# Patient Record
Sex: Female | Born: 1990 | Hispanic: Yes | Smoking: Never smoker
Health system: Southern US, Community
[De-identification: ages and names within clinical notes are randomized; demographics above are authoritative.]

## PROBLEM LIST (undated history)

## (undated) DIAGNOSIS — Z789 Other specified health status: Secondary | ICD-10-CM

## (undated) DIAGNOSIS — D573 Sickle-cell trait: Secondary | ICD-10-CM

## (undated) DIAGNOSIS — O0930 Supervision of pregnancy with insufficient antenatal care, unspecified trimester: Secondary | ICD-10-CM

## (undated) DIAGNOSIS — O24419 Gestational diabetes mellitus in pregnancy, unspecified control: Secondary | ICD-10-CM

## (undated) DIAGNOSIS — O1490 Unspecified pre-eclampsia, unspecified trimester: Secondary | ICD-10-CM

## (undated) DIAGNOSIS — A5901 Trichomonal vulvovaginitis: Secondary | ICD-10-CM

## (undated) HISTORY — DX: Gestational diabetes mellitus in pregnancy, unspecified control: O24.419

## (undated) HISTORY — PX: NO PAST SURGERIES: SHX2092

## (undated) HISTORY — DX: Unspecified pre-eclampsia, unspecified trimester: O14.90

---

## 2010-09-02 NOTE — L&D Delivery Note (Signed)
Delivery Note At 2100 a viable female was delivered via VAVD (Presentation: ROA ;  ).  Thick meconium present at delivery. APGAR: 8, 9; weight 7lb 11oz.   Placenta status: spontaneous, intact.  Cord: 3-vessel with the following complications: none.    Anesthesia:  epidural Episiotomy: n/a Lacerations: hemastatic 1st degree perineal, right labial repair Suture Repair: 3.0 vicryl Est. Blood Loss (mL): 300  Mom to postpartum.  Baby to nursery-stable.  BOOTH, ERIN 06/01/2011, 9:22 PM

## 2011-01-29 ENCOUNTER — Other Ambulatory Visit: Payer: Self-pay | Admitting: Family Medicine

## 2011-01-29 DIAGNOSIS — Z3689 Encounter for other specified antenatal screening: Secondary | ICD-10-CM

## 2011-01-29 LAB — HIV ANTIBODY (ROUTINE TESTING W REFLEX): HIV: NONREACTIVE

## 2011-01-29 LAB — GC/CHLAMYDIA PROBE AMP, GENITAL: Gonorrhea: NEGATIVE

## 2011-01-29 LAB — ABO/RH: RH Type: POSITIVE

## 2011-01-31 ENCOUNTER — Ambulatory Visit (HOSPITAL_COMMUNITY)
Admission: RE | Admit: 2011-01-31 | Discharge: 2011-01-31 | Disposition: A | Discharge status: Home / Self Care | Payer: Medicaid Other | Source: Ambulatory Visit | Attending: Family Medicine | Admitting: Family Medicine

## 2011-01-31 DIAGNOSIS — O358XX0 Maternal care for other (suspected) fetal abnormality and damage, not applicable or unspecified: Secondary | ICD-10-CM | POA: Insufficient documentation

## 2011-01-31 DIAGNOSIS — Z1389 Encounter for screening for other disorder: Secondary | ICD-10-CM | POA: Insufficient documentation

## 2011-01-31 DIAGNOSIS — Z363 Encounter for antenatal screening for malformations: Secondary | ICD-10-CM | POA: Insufficient documentation

## 2011-01-31 DIAGNOSIS — Z3689 Encounter for other specified antenatal screening: Secondary | ICD-10-CM

## 2011-03-18 LAB — HIV ANTIBODY (ROUTINE TESTING W REFLEX): HIV: NONREACTIVE

## 2011-03-18 LAB — RPR: RPR: NONREACTIVE

## 2011-05-17 ENCOUNTER — Encounter (HOSPITAL_COMMUNITY): Payer: Self-pay | Admitting: *Deleted

## 2011-05-17 ENCOUNTER — Inpatient Hospital Stay (HOSPITAL_COMMUNITY)
Admission: AD | Admit: 2011-05-17 | Discharge: 2011-05-18 | Disposition: A | Discharge status: Home / Self Care | Payer: Medicaid Other | Source: Ambulatory Visit | Attending: Obstetrics and Gynecology | Admitting: Obstetrics and Gynecology

## 2011-05-17 DIAGNOSIS — A5901 Trichomonal vulvovaginitis: Secondary | ICD-10-CM | POA: Insufficient documentation

## 2011-05-17 DIAGNOSIS — O479 False labor, unspecified: Secondary | ICD-10-CM | POA: Insufficient documentation

## 2011-05-17 DIAGNOSIS — O98819 Other maternal infectious and parasitic diseases complicating pregnancy, unspecified trimester: Secondary | ICD-10-CM | POA: Insufficient documentation

## 2011-05-17 NOTE — Progress Notes (Signed)
Pt states, " I stated having contractions in my low abd that goes around to my back at 8:00 pm tonight. I have one every 30 min."

## 2011-05-18 ENCOUNTER — Encounter (HOSPITAL_COMMUNITY): Payer: Self-pay | Admitting: *Deleted

## 2011-05-18 NOTE — ED Provider Notes (Signed)
History   20yo G1/0 @37 .2 who presents with contractions since 2030. Denies VB or LOF. Good FM. No other complaints. Seen in clinic yesterday, cervical exam was "normal." Also reports that she was dx with trichomonas yesterday and just began antibiotics. Denies discharge or discomfort.   Pregnancy complicated by sickle cell trait.   Chief Complaint  Patient presents with  . Contractions   HPI  OB History    Grav Para Term Preterm Abortions TAB SAB Ect Mult Living   1               No past medical history on file.  No past surgical history on file.  No family history on file.  History  Substance Use Topics  . Smoking status: Not on file  . Smokeless tobacco: Not on file  . Alcohol Use: Not on file    Allergies:  Allergies  Allergen Reactions  . Penicillins Rash    Skin gets red    Prescriptions prior to admission  Medication Sig Dispense Refill  . prenatal vitamin w/FE, FA (PRENATAL 1 + 1) 27-1 MG TABS Take 1 tablet by mouth daily.        Marland Kitchen UNKNOWN TO PATIENT Take 1 tablet by mouth daily.          Review of Systems  Constitutional: Negative for fever and chills.  Eyes: Negative for blurred vision and double vision.  Respiratory: Negative for cough, shortness of breath and wheezing.   Cardiovascular: Negative for chest pain and leg swelling.  Gastrointestinal: Negative for heartburn, nausea and diarrhea.  Genitourinary: Negative for dysuria and urgency.  Skin: Negative for itching and rash.  Neurological: Negative for dizziness, seizures, loss of consciousness and headaches.  Endo/Heme/Allergies: Negative for polydipsia.  Psychiatric/Behavioral: Negative for depression.   Physical Exam   Blood pressure 132/81, pulse 79, temperature 97.3 F (36.3 C), temperature source Oral, resp. rate 20, height 5' (1.524 m), weight 180 lb 6 oz (81.818 kg).  Physical Exam  Constitutional: She is oriented to person, place, and time. She appears well-developed and  well-nourished.  HENT:  Head: Normocephalic and atraumatic.  Eyes: Conjunctivae are normal. Pupils are equal, round, and reactive to light.  Neck: Normal range of motion. Neck supple.  Cardiovascular: Normal rate, regular rhythm and normal heart sounds.  Exam reveals no gallop and no friction rub.   No murmur heard. Respiratory: Breath sounds normal. No respiratory distress. She has no wheezes. She has no rales. She exhibits no tenderness.  GI: Soft. Bowel sounds are normal. She exhibits no distension.  Genitourinary: Vagina normal. No vaginal discharge found.  Musculoskeletal: Normal range of motion. She exhibits no edema and no tenderness.  Neurological: She is alert and oriented to person, place, and time. No cranial nerve deficit. Coordination normal.  Skin: Skin is warm and dry. No rash noted. No erythema.    MAU Course  Procedures  MDM NST  Assessment and Plan  20yo G1 @37 -2 here for r/o Labor 1)R/o Labor - Cervix unchanged. Labor precautions discussed. D/c home. 2)FWB - Reactive NST, reassuring 3)Trichomonas - Patient reports that she was put on an unknown medication QD x7 days. Has f/u appt. On Thursday. Instructed to continue medication, keep her next appointment. 4)Sickle cell trait 5)+/I/? 6)keep next scheduled appt.  Daylah Sayavong, Beverely Pace A 05/18/2011, 1:32 AM

## 2011-06-01 ENCOUNTER — Encounter (HOSPITAL_COMMUNITY): Payer: Self-pay | Admitting: Obstetrics and Gynecology

## 2011-06-01 ENCOUNTER — Inpatient Hospital Stay (HOSPITAL_COMMUNITY): Payer: Medicaid Other | Admitting: Anesthesiology

## 2011-06-01 ENCOUNTER — Inpatient Hospital Stay (HOSPITAL_COMMUNITY)
Admission: AD | Admit: 2011-06-01 | Discharge: 2011-06-03 | DRG: 775 | Disposition: A | Discharge status: Home / Self Care | Payer: Medicaid Other | Source: Ambulatory Visit | Attending: Obstetrics & Gynecology | Admitting: Obstetrics & Gynecology

## 2011-06-01 ENCOUNTER — Encounter (HOSPITAL_COMMUNITY): Payer: Self-pay | Admitting: Anesthesiology

## 2011-06-01 DIAGNOSIS — Z22330 Carrier of Group B streptococcus: Secondary | ICD-10-CM

## 2011-06-01 DIAGNOSIS — O99892 Other specified diseases and conditions complicating childbirth: Secondary | ICD-10-CM | POA: Diagnosis present

## 2011-06-01 HISTORY — DX: Other specified health status: Z78.9

## 2011-06-01 LAB — CBC
Platelets: 322 10*3/uL (ref 150–400)
RBC: 5.12 MIL/uL — ABNORMAL HIGH (ref 3.87–5.11)
RDW: 16.6 % — ABNORMAL HIGH (ref 11.5–15.5)
WBC: 10.1 10*3/uL (ref 4.0–10.5)

## 2011-06-01 LAB — RPR: RPR Ser Ql: NONREACTIVE

## 2011-06-01 MED ORDER — OXYTOCIN BOLUS FROM INFUSION
500.0000 mL | Freq: Once | INTRAVENOUS | Status: DC
  Filled 2011-06-01: qty 1000
  Filled 2011-06-01: qty 500

## 2011-06-01 MED ORDER — PHENYLEPHRINE 40 MCG/ML (10ML) SYRINGE FOR IV PUSH (FOR BLOOD PRESSURE SUPPORT)
80.0000 ug | PREFILLED_SYRINGE | INTRAVENOUS | Status: DC | PRN
  Filled 2011-06-01: qty 5

## 2011-06-01 MED ORDER — LIDOCAINE HCL (PF) 1 % IJ SOLN
30.0000 mL | INTRAMUSCULAR | Status: DC | PRN
  Filled 2011-06-01 (×2): qty 30

## 2011-06-01 MED ORDER — LACTATED RINGERS IV SOLN
500.0000 mL | INTRAVENOUS | Status: DC | PRN
  Administered 2011-06-01: 1000 mL via INTRAVENOUS

## 2011-06-01 MED ORDER — CEFAZOLIN SODIUM 1-5 GM-% IV SOLN
1.0000 g | Freq: Three times a day (TID) | INTRAVENOUS | Status: DC
  Administered 2011-06-01: 1 g via INTRAVENOUS
  Filled 2011-06-01 (×3): qty 50

## 2011-06-01 MED ORDER — PHENYLEPHRINE 40 MCG/ML (10ML) SYRINGE FOR IV PUSH (FOR BLOOD PRESSURE SUPPORT)
80.0000 ug | PREFILLED_SYRINGE | INTRAVENOUS | Status: DC | PRN
  Filled 2011-06-01 (×3): qty 5

## 2011-06-01 MED ORDER — DIPHENHYDRAMINE HCL 50 MG/ML IJ SOLN: 12.5000 mg | INTRAMUSCULAR | Status: DC | PRN

## 2011-06-01 MED ORDER — PNEUMOCOCCAL VAC POLYVALENT 25 MCG/0.5ML IJ INJ
0.5000 mL | INJECTION | INTRAMUSCULAR | Status: DC
  Filled 2011-06-01: qty 0.5

## 2011-06-01 MED ORDER — FENTANYL 2.5 MCG/ML BUPIVACAINE 1/10 % EPIDURAL INFUSION (WH - ANES)
14.0000 mL/h | INTRAMUSCULAR | Status: DC
  Administered 2011-06-01 (×2): 14 mL/h via EPIDURAL
  Administered 2011-06-01: 12 mL/h via EPIDURAL
  Filled 2011-06-01 (×2): qty 60

## 2011-06-01 MED ORDER — FENTANYL 2.5 MCG/ML BUPIVACAINE 1/10 % EPIDURAL INFUSION (WH - ANES)
14.0000 mL/h | INTRAMUSCULAR | Status: DC
  Filled 2011-06-01: qty 60

## 2011-06-01 MED ORDER — OXYTOCIN 20 UNITS IN LACTATED RINGERS INFUSION - SIMPLE: 125.0000 mL/h | Freq: Once | INTRAVENOUS | Status: DC

## 2011-06-01 MED ORDER — SODIUM BICARBONATE 8.4 % IV SOLN
INTRAVENOUS | Status: DC | PRN
  Administered 2011-06-01: 4 mL via EPIDURAL

## 2011-06-01 MED ORDER — ACETAMINOPHEN 325 MG PO TABS: 650.0000 mg | ORAL_TABLET | ORAL | Status: DC | PRN

## 2011-06-01 MED ORDER — CITRIC ACID-SODIUM CITRATE 334-500 MG/5ML PO SOLN: 30.0000 mL | ORAL | Status: DC | PRN

## 2011-06-01 MED ORDER — NALBUPHINE SYRINGE 5 MG/0.5 ML
5.0000 mg | INJECTION | INTRAMUSCULAR | Status: DC | PRN
  Filled 2011-06-01: qty 0.5

## 2011-06-01 MED ORDER — CEFAZOLIN SODIUM-DEXTROSE 2-3 GM-% IV SOLR
2.0000 g | Freq: Once | INTRAVENOUS | Status: AC
  Administered 2011-06-01: 2 g via INTRAVENOUS
  Filled 2011-06-01: qty 50

## 2011-06-01 MED ORDER — LACTATED RINGERS IV SOLN: 500.0000 mL | Freq: Once | INTRAVENOUS | Status: DC

## 2011-06-01 MED ORDER — SODIUM BICARBONATE 8.4 % IV SOLN
INTRAVENOUS | Status: DC | PRN
  Administered 2011-06-01: 3 mL via EPIDURAL

## 2011-06-01 MED ORDER — EPHEDRINE 5 MG/ML INJ
10.0000 mg | INTRAVENOUS | Status: DC | PRN
  Filled 2011-06-01 (×3): qty 4

## 2011-06-01 MED ORDER — FLEET ENEMA 7-19 GM/118ML RE ENEM: 1.0000 | ENEMA | RECTAL | Status: DC | PRN

## 2011-06-01 MED ORDER — ONDANSETRON HCL 4 MG/2ML IJ SOLN: 4.0000 mg | Freq: Four times a day (QID) | INTRAMUSCULAR | Status: DC | PRN

## 2011-06-01 MED ORDER — OXYCODONE-ACETAMINOPHEN 5-325 MG PO TABS: 2.0000 | ORAL_TABLET | ORAL | Status: DC | PRN

## 2011-06-01 MED ORDER — IBUPROFEN 600 MG PO TABS: 600.0000 mg | ORAL_TABLET | Freq: Four times a day (QID) | ORAL | Status: DC | PRN

## 2011-06-01 MED ORDER — LACTATED RINGERS IV SOLN
INTRAVENOUS | Status: DC
  Administered 2011-06-01: 13:00:00 via INTRAVENOUS

## 2011-06-01 MED ORDER — EPHEDRINE 5 MG/ML INJ
10.0000 mg | INTRAVENOUS | Status: DC | PRN
  Filled 2011-06-01: qty 4

## 2011-06-01 NOTE — Progress Notes (Signed)
Pt vomiting.

## 2011-06-01 NOTE — Progress Notes (Signed)
Dr Adrian Blackwater and Dr Jean Rosenthal notified pt not feeling pressure or urge to push.  See orders

## 2011-06-01 NOTE — Anesthesia Procedure Notes (Addendum)
Epidural Patient location during procedure: OB  Staffing Anesthesiologist: Cason Dabney EDWARD  Preanesthetic Checklist Completed: patient identified, site marked, surgical consent, pre-op evaluation, timeout performed, IV checked, risks and benefits discussed and monitors and equipment checked  Epidural Patient position: sitting Prep: site prepped and draped and DuraPrep Patient monitoring: continuous pulse ox and blood pressure Approach: midline Injection technique: LOR air  Needle:  Needle type: Tuohy  Needle gauge: 17 G Needle length: 9 cm Needle insertion depth: 5 cm cm Catheter type: closed end flexible Catheter size: 19 Gauge Catheter at skin depth: 10 cm Test dose: negative  Assessment Events: blood not aspirated, injection not painful, no injection resistance, negative IV test and no paresthesia  Additional Notes Dosing of Epidural: 1st dose, through needle...... ( mg expressed as equavilent  cc's medication  from .1%Bupiv / fentanyl syringe from L&D pump)...............  4mg Marcaine  2nd dose, through catheter after waiting 3 minutes.... epi 1:200K + Xylocaine 40 mg 3rd dose, through catheter, after waiting 3 minutes.....epi 1:200K + Xylocaine 40 mg ( 2% Xylo charted as a single dose in Epic Meds for ease of charting; actual dosing was fractionated as above, for saftey's sake)  As each dose occurred, patient was free of IV sx; and patient exhibited no evidence of SA injection.  Patient is more comfortable after epidural dosed. Please see RN's note for documentation of vital signs,and FHR which are stable.    

## 2011-06-01 NOTE — H&P (Signed)
Shannon Diaz is a 20 y.o. female G1 P0 at 39 weeks and 2 days by LMP presenting with contractions. Maternal Medical History:  Reason for admission: Reason for admission: contractions.  Contractions: Onset was 3-5 hours ago.   Frequency: regular.   Perceived severity is moderate.    Fetal activity: Perceived fetal activity is decreased.   Last perceived fetal movement was within the past hour.    Prenatal complications: No bleeding, cholelithiasis, HIV, hypertension, infection, IUGR, nephrolithiasis, oligohydramnios, placental abnormality, polyhydramnios, pre-eclampsia, preterm labor, substance abuse, thrombocytopenia or thrombophilia.   Prenatal Complications - Diabetes: none.    OB History   Grav Para Term Preterm Abortions TAB SAB Ect Mult Living  1 0 0 0 0 0 0 0 0 0   Past Medical History Diagnosis Date . No pertinent past medical history   Past Surgical History Procedure Date . No past surgeries   Family History: family history is not on file. Social History:  reports that she has never smoked. She does not have any smokeless tobacco history on file. She reports that she does not drink alcohol or use illicit drugs.  Review of Systems  All other systems reviewed and are negative.    Dilation: 6 Effacement (%): 80 Station: -2 Exam by:: Dr.Inocencia Murtaugh  Blood pressure 129/79, pulse 91, temperature 98.6 F (37 C), temperature source Oral, resp. rate 20. Maternal Exam:  Uterine Assessment: Contraction strength is moderate.  Contraction frequency is regular.   Abdomen: Patient reports no abdominal tenderness. Estimated fetal weight is Approximately 7/2 pounds.   Fetal presentation: vertex  Introitus: Normal vulva. Normal vagina.  Ferning test: not done.  Nitrazine test: not done. Amniotic fluid character: not assessed.  Pelvis: adequate for delivery.   Cervix: Cervix evaluated by digital exam.     Fetal Exam Fetal Monitor Review: Mode: hand-held doppler  probe.   Baseline rate: 140.  Variability: moderate (6-25 bpm).   Pattern: accelerations present and no decelerations.    Fetal State Assessment: Category I - tracings are normal.     Physical Exam  Constitutional: She appears well-developed and well-nourished.  Cardiovascular: Normal rate and regular rhythm.   Respiratory: Effort normal and breath sounds normal.  GI: Soft. Bowel sounds are normal.  Skin: Skin is warm and dry.    Dilation: 6 Effacement (%): 80 Cervical Position: Posterior Station: -2 Presentation: Vertex Exam by:: Dr.Latoria Dry   Prenatal labs: ABO, Rh: A/Positive/-- (05/29 0000) Antibody:   Rubella: Immune (05/29 0000) RPR: Nonreactive (07/16 0000)  HBsAg: Negative (05/29 0000)  HIV: Non-reactive (07/16 0000)  GBS:     Assessment/Plan: #1  20-year-old G1 P0 with intrauterine pregnancy at 39 weeks and 2 days in spontaneous labor #2 GBS positive #3 language barrier.  Limits patient to labor and delivery and continue to manage the patient's labor. As the patient is group B strep positive and penicillin allergic we will start the patient in the per prophylaxis. The patient's allergy to penicillin is a rash therefore we will start cefazolin. The parents did not have any doctor for the baby yet therefore the baby will be seen by the pediatric teaching service. The patient will breast and bottlefeed. The patient will have Depo-Provera for contraception following the birth.  Bernadette Gores JEHIEL 06/01/2011, 10:27 AM    

## 2011-06-01 NOTE — Plan of Care (Signed)
Problem: Consults Goal: Birthing Suites Patient Information Press F2 to bring up selections list   Pt 37-[redacted] weeks EGA     

## 2011-06-01 NOTE — Progress Notes (Signed)
Subjective: Comfortable with epidural.  No questions or concerns.  Objective: BP 99/61  Pulse 82  Temp(Src) 98.5 F (36.9 C) (Oral)  Resp 20  Ht 5' (1.524 m)  Wt 81.647 kg (180 lb)  BMI 35.15 kg/m2  SpO2 99%      FHT:  FHR: 120 bpm, variability: moderate,  accelerations:  Present,  decelerations:  Absent UC:   regular, every 3 minutes SVE:   Dilation: 7 Effacement (%): 80 Station: -1 Exam by:: Dr Adrian Blackwater  Labs: Lab Results  Component Value Date   WBC 10.1 06/01/2011   HGB 12.5 06/01/2011   HCT 37.3 06/01/2011   MCV 72.9* 06/01/2011   PLT 322 06/01/2011    Assessment / Plan: Spontaneous labor, progressing normally  Labor: AROM with clear fluid Preeclampsia:  NA Fetal Wellbeing:  Category I Pain Control:  Epidural I/D:  cefazolin for GBS Anticipated MOD:  NSVD  Tyler Cubit JEHIEL 06/01/2011, 2:47 PM

## 2011-06-01 NOTE — Progress Notes (Signed)
Pt not pushing effectively, pt not feeling pressure or uc's at this time

## 2011-06-01 NOTE — Progress Notes (Signed)
Pt presents to MAU with complaints of contractions that started at 0700 today. Pt is seen at [facility]. At was seen this week where they told her she was 4-5 cm. Pt states the contractions are Q10 mins.

## 2011-06-01 NOTE — ED Provider Notes (Signed)
Shannon Diaz is a 20 y.o. female G1 P0 at 39 weeks and 2 days by LMP presenting with contractions. Maternal Medical History:  Reason for admission: Reason for admission: contractions.  Contractions: Onset was 3-5 hours ago.   Frequency: regular.   Perceived severity is moderate.    Fetal activity: Perceived fetal activity is decreased.   Last perceived fetal movement was within the past hour.    Prenatal complications: No bleeding, cholelithiasis, HIV, hypertension, infection, IUGR, nephrolithiasis, oligohydramnios, placental abnormality, polyhydramnios, pre-eclampsia, preterm labor, substance abuse, thrombocytopenia or thrombophilia.   Prenatal Complications - Diabetes: none.    OB History   Grav Para Term Preterm Abortions TAB SAB Ect Mult Living  1 0 0 0 0 0 0 0 0 0    Past Medical History Diagnosis Date . No pertinent past medical history   Past Surgical History Procedure Date . No past surgeries   Family History: family history is not on file. Social History:  reports that she has never smoked. She does not have any smokeless tobacco history on file. She reports that she does not drink alcohol or use illicit drugs.  Review of Systems  All other systems reviewed and are negative.    Dilation: 6 Effacement (%): 80 Station: -2 Exam by:: Dr.Verdon Ferrante  Blood pressure 129/79, pulse 91, temperature 98.6 F (37 C), temperature source Oral, resp. rate 20. Maternal Exam:  Uterine Assessment: Contraction strength is moderate.  Contraction frequency is regular.   Abdomen: Patient reports no abdominal tenderness. Estimated fetal weight is Approximately 7/2 pounds.   Fetal presentation: vertex  Introitus: Normal vulva. Normal vagina.  Ferning test: not done.  Nitrazine test: not done. Amniotic fluid character: not assessed.  Pelvis: adequate for delivery.   Cervix: Cervix evaluated by digital exam.     Fetal Exam Fetal Monitor Review: Mode: hand-held doppler  probe.   Baseline rate: 140.  Variability: moderate (6-25 bpm).   Pattern: accelerations present and no decelerations.    Fetal State Assessment: Category I - tracings are normal.     Physical Exam  Constitutional: She appears well-developed and well-nourished.  Cardiovascular: Normal rate and regular rhythm.   Respiratory: Effort normal and breath sounds normal.  GI: Soft. Bowel sounds are normal.  Skin: Skin is warm and dry.    Dilation: 6 Effacement (%): 80 Cervical Position: Posterior Station: -2 Presentation: Vertex Exam by:: Dr.Dontrelle Mazon   Prenatal labs: ABO, Rh: A/Positive/-- (05/29 0000) Antibody:   Rubella: Immune (05/29 0000) RPR: Nonreactive (07/16 0000)  HBsAg: Negative (05/29 0000)  HIV: Non-reactive (07/16 0000)  GBS:     Assessment/Plan: #32  20 year old G1 P0 with intrauterine pregnancy at 39 weeks and 2 days in spontaneous labor #2 GBS positive #3 language barrier.  Limits patient to labor and delivery and continue to manage the patient's labor. As the patient is group B strep positive and penicillin allergic we will start the patient in the per prophylaxis. The patient's allergy to penicillin is a rash therefore we will start cefazolin. The parents did not have any doctor for the baby yet therefore the baby will be seen by the pediatric teaching service. The patient will breast and bottlefeed. The patient will have Depo-Provera for contraception following the birth.  Hasani Diemer JEHIEL 06/01/2011, 10:27 AM

## 2011-06-01 NOTE — Anesthesia Preprocedure Evaluation (Signed)
Anesthesia Evaluation  Name, MR# and DOB Patient awake  General Assessment Comment  Reviewed: Allergy & Precautions, H&P , Patient's Chart, lab work & pertinent test results  Airway Mallampati: II TM Distance: >3 FB Neck ROM: full    Dental  (+) Teeth Intact   Pulmonary  clear to auscultation  breath sounds clear to auscultation none    Cardiovascular regular Normal    Neuro/Psych   GI/Hepatic/Renal   Endo/Other  (+)   Morbid obesity  Abdominal   Musculoskeletal   Hematology   Peds  Reproductive/Obstetrics (+) Pregnancy    Anesthesia Other Findings                 Anesthesia Physical Anesthesia Plan  ASA: II  Anesthesia Plan: Epidural   Post-op Pain Management:    Induction:   Airway Management Planned:   Additional Equipment:   Intra-op Plan:   Post-operative Plan:   Informed Consent: I have reviewed the patients History and Physical, chart, labs and discussed the procedure including the risks, benefits and alternatives for the proposed anesthesia with the patient or authorized representative who has indicated his/her understanding and acceptance.   Dental Advisory Given  Plan Discussed with: CRNA and Surgeon  Anesthesia Plan Comments: (Labs checked- platelets confirmed with RN in room. Fetal heart tracing, per RN, reportedly stable enough for sitting procedure. Discussed epidural, and patient consents to the procedure:  included risk of possible headache,backache, failed block, allergic reaction, and nerve injury. This patient was asked if she had any questions or concerns before the procedure started. )        Anesthesia Quick Evaluation

## 2011-06-02 MED ORDER — ZOLPIDEM TARTRATE 5 MG PO TABS: 5.0000 mg | ORAL_TABLET | Freq: Every evening | ORAL | Status: DC | PRN

## 2011-06-02 MED ORDER — DIBUCAINE 1 % RE OINT: 1.0000 "application " | TOPICAL_OINTMENT | RECTAL | Status: DC | PRN

## 2011-06-02 MED ORDER — OXYCODONE-ACETAMINOPHEN 5-325 MG PO TABS
1.0000 | ORAL_TABLET | ORAL | Status: DC | PRN
  Administered 2011-06-03: 1 via ORAL
  Filled 2011-06-02: qty 1

## 2011-06-02 MED ORDER — SIMETHICONE 80 MG PO CHEW: 80.0000 mg | CHEWABLE_TABLET | ORAL | Status: DC | PRN

## 2011-06-02 MED ORDER — TETANUS-DIPHTH-ACELL PERTUSSIS 5-2.5-18.5 LF-MCG/0.5 IM SUSP
0.5000 mL | Freq: Once | INTRAMUSCULAR | Status: AC
  Administered 2011-06-02: 0.5 mL via INTRAMUSCULAR
  Filled 2011-06-02 (×2): qty 0.5

## 2011-06-02 MED ORDER — DIPHENHYDRAMINE HCL 25 MG PO CAPS: 25.0000 mg | ORAL_CAPSULE | Freq: Four times a day (QID) | ORAL | Status: DC | PRN

## 2011-06-02 MED ORDER — INFLUENZA VIRUS VACC SPLIT PF IM SUSP
0.5000 mL | Freq: Once | INTRAMUSCULAR | Status: AC
  Administered 2011-06-03: 0.5 mL via INTRAMUSCULAR
  Filled 2011-06-02: qty 0.5

## 2011-06-02 MED ORDER — WITCH HAZEL-GLYCERIN EX PADS: 1.0000 "application " | MEDICATED_PAD | CUTANEOUS | Status: DC | PRN

## 2011-06-02 MED ORDER — LANOLIN HYDROUS EX OINT: TOPICAL_OINTMENT | CUTANEOUS | Status: DC | PRN

## 2011-06-02 MED ORDER — ONDANSETRON HCL 4 MG PO TABS: 4.0000 mg | ORAL_TABLET | ORAL | Status: DC | PRN

## 2011-06-02 MED ORDER — PRENATAL PLUS 27-1 MG PO TABS
1.0000 | ORAL_TABLET | Freq: Every day | ORAL | Status: DC
  Administered 2011-06-02 – 2011-06-03 (×2): 1 via ORAL
  Filled 2011-06-02 (×2): qty 1

## 2011-06-02 MED ORDER — IBUPROFEN 600 MG PO TABS
600.0000 mg | ORAL_TABLET | Freq: Four times a day (QID) | ORAL | Status: DC
  Administered 2011-06-02 – 2011-06-03 (×7): 600 mg via ORAL
  Filled 2011-06-02 (×6): qty 1

## 2011-06-02 MED ORDER — ONDANSETRON HCL 4 MG/2ML IJ SOLN: 4.0000 mg | INTRAMUSCULAR | Status: DC | PRN

## 2011-06-02 MED ORDER — SENNOSIDES-DOCUSATE SODIUM 8.6-50 MG PO TABS
2.0000 | ORAL_TABLET | Freq: Every day | ORAL | Status: DC
  Administered 2011-06-02: 2 via ORAL

## 2011-06-02 MED ORDER — BENZOCAINE-MENTHOL 20-0.5 % EX AERO
INHALATION_SPRAY | CUTANEOUS | Status: AC
  Administered 2011-06-02: 06:00:00
  Filled 2011-06-02: qty 56

## 2011-06-02 MED ORDER — BENZOCAINE-MENTHOL 20-0.5 % EX AERO
1.0000 "application " | INHALATION_SPRAY | CUTANEOUS | Status: DC | PRN
  Administered 2011-06-02: 1 via TOPICAL

## 2011-06-02 NOTE — Progress Notes (Signed)
Post Partum Day 1 Subjective: no complaints, up ad lib, voiding, tolerating PO, + flatus and no BM yet.  Br/Bo feeding.  Planning on depo for birth control.  Objective: Blood pressure 104/69, pulse 78, temperature 97.4 F (36.3 C), temperature source Oral, resp. rate 18, height 5' (1.524 m), weight 180 lb (81.647 kg), SpO2 99.00%, unknown if currently breastfeeding.  Physical Exam:  General: alert, cooperative, appears stated age and no distress Lochia: appropriate Uterine Fundus: firm DVT Evaluation: No evidence of DVT seen on physical exam. Negative Homan's sign. No significant calf/ankle edema.   Basename 06/01/11 1100  HGB 12.5  HCT 37.3    Assessment/Plan: Plan for discharge tomorrow   LOS: 1 day   Diaz, Shannon Polyakov 06/02/2011, 7:24 AM

## 2011-06-02 NOTE — Anesthesia Postprocedure Evaluation (Signed)
  Anesthesia Post-op Note  Patient: Shannon Diaz  Procedure(s) Performed: * No procedures listed *  Patient Location: PACU and Mother/Baby  Anesthesia Type: Epidural  Level of Consciousness: awake, alert  and oriented  Airway and Oxygen Therapy: Patient Spontanous Breathing   Post-op Assessment: Patient's Cardiovascular Status Stable and Respiratory Function Stable  Post-op Vital Signs: stable  Complications: No apparent anesthesia complications

## 2011-06-03 MED ORDER — OXYCODONE-ACETAMINOPHEN 5-325 MG PO TABS: 1.0000 | ORAL_TABLET | ORAL | Status: AC | PRN

## 2011-06-03 MED ORDER — IBUPROFEN 600 MG PO TABS: 600.0000 mg | ORAL_TABLET | Freq: Four times a day (QID) | ORAL | Status: AC

## 2011-06-03 NOTE — Progress Notes (Signed)
Post Partum Day 2 Subjective: no complaints, up ad lib, voiding, tolerating PO and + flatus  Objective: Blood pressure 107/74, pulse 81, temperature 97.9 F (36.6 C), temperature source Oral, resp. rate 18, height 5' (1.524 m), weight 81.647 kg (180 lb), SpO2 97.00%, unknown if currently breastfeeding.  Physical Exam:  General: alert, cooperative, appears stated age and no distress Lochia: appropriate Uterine Fundus: firm Incision: n/a DVT Evaluation: No evidence of DVT seen on physical exam. Negative Homan's sign. No cords or calf tenderness. No significant calf/ankle edema.   Basename 06/01/11 1100  HGB 12.5  HCT 37.3    Assessment/Plan: Discharge home   LOS: 2 days   Shannon Diaz 06/03/2011, 7:33 AM

## 2011-06-03 NOTE — Progress Notes (Signed)
UR chart review completed.  

## 2011-06-03 NOTE — Discharge Summary (Signed)
Obstetric Discharge Summary Reason for Admission: onset of labor Prenatal Procedures: ultrasound Intrapartum Procedures: spontaneous vaginal delivery Postpartum Procedures: none Complications-Operative and Postpartum: none Hemoglobin  Date Value Range Status  06/01/2011 12.5  12.0-15.0 (g/dL) Final     HCT  Date Value Range Status  06/01/2011 37.3  36.0-46.0 (%) Final    Discharge Diagnoses: Term Pregnancy-delivered  Discharge Information: Date: 06/03/2011 Activity: pelvic rest Diet: routine Medications: PNV, Ibuprofen and Percocet Condition: stable and improved Instructions: refer to practice specific booklet Discharge to: home   Newborn Data: Live born female  Birth Weight: 7 lb 11.1 oz (3490 g) APGAR: 8, 9  Home with mother.  Shannon Diaz 06/03/2011, 7:34 AM

## 2011-06-04 NOTE — Discharge Summary (Signed)
Agree with above note.  Shannon Diaz H. 06/04/2011 5:01 PM

## 2012-03-02 LAB — OB RESULTS CONSOLE RPR: RPR: NONREACTIVE

## 2012-03-02 LAB — OB RESULTS CONSOLE GC/CHLAMYDIA
Chlamydia: NEGATIVE
Gonorrhea: NEGATIVE

## 2012-03-02 LAB — OB RESULTS CONSOLE HIV ANTIBODY (ROUTINE TESTING): HIV: NONREACTIVE

## 2012-06-01 ENCOUNTER — Other Ambulatory Visit (HOSPITAL_COMMUNITY)
Admission: RE | Admit: 2012-06-01 | Discharge: 2012-06-01 | Disposition: A | Discharge status: Home / Self Care | Payer: Medicaid Other | Source: Ambulatory Visit | Attending: Obstetrics and Gynecology | Admitting: Obstetrics and Gynecology

## 2012-06-01 DIAGNOSIS — Z113 Encounter for screening for infections with a predominantly sexual mode of transmission: Secondary | ICD-10-CM | POA: Insufficient documentation

## 2012-06-01 DIAGNOSIS — Z01419 Encounter for gynecological examination (general) (routine) without abnormal findings: Secondary | ICD-10-CM | POA: Insufficient documentation

## 2012-06-01 LAB — OB RESULTS CONSOLE GBS
GBS: NEGATIVE
GBS: POSITIVE

## 2012-09-05 ENCOUNTER — Encounter (HOSPITAL_COMMUNITY): Payer: Self-pay | Admitting: Anesthesiology

## 2012-09-05 ENCOUNTER — Encounter (HOSPITAL_COMMUNITY): Payer: Self-pay | Admitting: *Deleted

## 2012-09-05 ENCOUNTER — Inpatient Hospital Stay (HOSPITAL_COMMUNITY)
Admission: AD | Admit: 2012-09-05 | Discharge: 2012-09-08 | DRG: 765 | Disposition: A | Discharge status: Home / Self Care | Payer: Medicaid Other | Source: Ambulatory Visit | Attending: Obstetrics & Gynecology | Admitting: Obstetrics & Gynecology

## 2012-09-05 ENCOUNTER — Inpatient Hospital Stay (HOSPITAL_COMMUNITY): Payer: Medicaid Other | Admitting: Anesthesiology

## 2012-09-05 ENCOUNTER — Encounter (HOSPITAL_COMMUNITY)
Admission: AD | Disposition: A | Discharge status: Home / Self Care | Payer: Self-pay | Source: Ambulatory Visit | Attending: Obstetrics & Gynecology

## 2012-09-05 DIAGNOSIS — Z22330 Carrier of Group B streptococcus: Secondary | ICD-10-CM

## 2012-09-05 DIAGNOSIS — O4590 Premature separation of placenta, unspecified, unspecified trimester: Secondary | ICD-10-CM

## 2012-09-05 DIAGNOSIS — O9903 Anemia complicating the puerperium: Secondary | ICD-10-CM | POA: Diagnosis not present

## 2012-09-05 DIAGNOSIS — O4290 Premature rupture of membranes, unspecified as to length of time between rupture and onset of labor, unspecified weeks of gestation: Secondary | ICD-10-CM

## 2012-09-05 DIAGNOSIS — O99892 Other specified diseases and conditions complicating childbirth: Secondary | ICD-10-CM | POA: Diagnosis present

## 2012-09-05 DIAGNOSIS — D649 Anemia, unspecified: Secondary | ICD-10-CM | POA: Diagnosis not present

## 2012-09-05 DIAGNOSIS — Z98891 History of uterine scar from previous surgery: Secondary | ICD-10-CM

## 2012-09-05 HISTORY — DX: Sickle-cell trait: D57.3

## 2012-09-05 HISTORY — DX: Supervision of pregnancy with insufficient antenatal care, unspecified trimester: O09.30

## 2012-09-05 HISTORY — DX: Trichomonal vulvovaginitis: A59.01

## 2012-09-05 LAB — CBC
HCT: 33.8 % — ABNORMAL LOW (ref 36.0–46.0)
MCHC: 33.7 g/dL (ref 30.0–36.0)
MCV: 72.4 fL — ABNORMAL LOW (ref 78.0–100.0)
Platelets: 366 10*3/uL (ref 150–400)
RDW: 16 % — ABNORMAL HIGH (ref 11.5–15.5)
WBC: 13.6 10*3/uL — ABNORMAL HIGH (ref 4.0–10.5)

## 2012-09-05 LAB — POCT FERN TEST: POCT Fern Test: POSITIVE

## 2012-09-05 LAB — PREPARE RBC (CROSSMATCH)

## 2012-09-05 LAB — ABO/RH: ABO/RH(D): A POS

## 2012-09-05 SURGERY — Surgical Case
Anesthesia: Epidural | Site: Abdomen | Wound class: Clean Contaminated

## 2012-09-05 MED ORDER — FLEET ENEMA 7-19 GM/118ML RE ENEM: 1.0000 | ENEMA | RECTAL | Status: DC | PRN

## 2012-09-05 MED ORDER — LACTATED RINGERS IV SOLN
INTRAVENOUS | Status: DC
  Administered 2012-09-05: 20:00:00 via INTRAVENOUS
  Administered 2012-09-05: 125 mL/h via INTRAVENOUS

## 2012-09-05 MED ORDER — ONDANSETRON HCL 4 MG/2ML IJ SOLN: 4.0000 mg | Freq: Four times a day (QID) | INTRAMUSCULAR | Status: DC | PRN

## 2012-09-05 MED ORDER — LIDOCAINE HCL (PF) 1 % IJ SOLN
30.0000 mL | INTRAMUSCULAR | Status: DC | PRN
  Filled 2012-09-05: qty 30

## 2012-09-05 MED ORDER — DIPHENHYDRAMINE HCL 50 MG/ML IJ SOLN: 12.5000 mg | INTRAMUSCULAR | Status: DC | PRN

## 2012-09-05 MED ORDER — PHENYLEPHRINE 40 MCG/ML (10ML) SYRINGE FOR IV PUSH (FOR BLOOD PRESSURE SUPPORT): 80.0000 ug | PREFILLED_SYRINGE | INTRAVENOUS | Status: DC | PRN

## 2012-09-05 MED ORDER — FENTANYL CITRATE 0.05 MG/ML IJ SOLN
INTRAMUSCULAR | Status: DC | PRN
  Administered 2012-09-05: 100 ug via INTRAVENOUS

## 2012-09-05 MED ORDER — TERBUTALINE SULFATE 1 MG/ML IJ SOLN
INTRAMUSCULAR | Status: AC
  Administered 2012-09-05: 23:00:00
  Filled 2012-09-05: qty 1

## 2012-09-05 MED ORDER — CEFAZOLIN SODIUM 1-5 GM-% IV SOLN
INTRAVENOUS | Status: DC | PRN
  Administered 2012-09-05: 1 g via INTRAVENOUS

## 2012-09-05 MED ORDER — LACTATED RINGERS IV SOLN: 500.0000 mL | INTRAVENOUS | Status: DC | PRN

## 2012-09-05 MED ORDER — LACTATED RINGERS IV SOLN
INTRAVENOUS | Status: DC | PRN
  Administered 2012-09-05 (×3): via INTRAVENOUS

## 2012-09-05 MED ORDER — MORPHINE SULFATE (PF) 0.5 MG/ML IJ SOLN
INTRAMUSCULAR | Status: DC | PRN
  Administered 2012-09-05: 3.5 mg via EPIDURAL

## 2012-09-05 MED ORDER — FENTANYL 2.5 MCG/ML BUPIVACAINE 1/10 % EPIDURAL INFUSION (WH - ANES)
14.0000 mL/h | INTRAMUSCULAR | Status: DC
  Filled 2012-09-05: qty 125

## 2012-09-05 MED ORDER — FENTANYL 2.5 MCG/ML BUPIVACAINE 1/10 % EPIDURAL INFUSION (WH - ANES)
INTRAMUSCULAR | Status: DC | PRN
  Administered 2012-09-05: 14 mL/h via EPIDURAL

## 2012-09-05 MED ORDER — ONDANSETRON HCL 4 MG/2ML IJ SOLN
INTRAMUSCULAR | Status: AC
  Filled 2012-09-05: qty 2

## 2012-09-05 MED ORDER — CEFAZOLIN SODIUM-DEXTROSE 2-3 GM-% IV SOLR
INTRAVENOUS | Status: AC
  Filled 2012-09-05: qty 50

## 2012-09-05 MED ORDER — HYDROXYZINE HCL 50 MG/ML IM SOLN
50.0000 mg | Freq: Four times a day (QID) | INTRAMUSCULAR | Status: DC | PRN
  Filled 2012-09-05: qty 1

## 2012-09-05 MED ORDER — EPHEDRINE 5 MG/ML INJ: 10.0000 mg | INTRAVENOUS | Status: DC | PRN

## 2012-09-05 MED ORDER — CITRIC ACID-SODIUM CITRATE 334-500 MG/5ML PO SOLN
30.0000 mL | ORAL | Status: DC | PRN
  Administered 2012-09-05: 30 mL via ORAL
  Filled 2012-09-05: qty 15

## 2012-09-05 MED ORDER — OXYTOCIN 10 UNIT/ML IJ SOLN
INTRAMUSCULAR | Status: AC
  Filled 2012-09-05: qty 4

## 2012-09-05 MED ORDER — NALBUPHINE SYRINGE 5 MG/0.5 ML
5.0000 mg | INJECTION | INTRAMUSCULAR | Status: DC | PRN
  Administered 2012-09-05 (×2): 5 mg via INTRAVENOUS
  Filled 2012-09-05 (×2): qty 0.5

## 2012-09-05 MED ORDER — BUPIVACAINE HCL (PF) 0.5 % IJ SOLN
INTRAMUSCULAR | Status: AC
  Filled 2012-09-05: qty 30

## 2012-09-05 MED ORDER — LIDOCAINE HCL (PF) 1 % IJ SOLN
INTRAMUSCULAR | Status: DC | PRN
  Administered 2012-09-05 (×2): 9 mL

## 2012-09-05 MED ORDER — CEFAZOLIN SODIUM 1-5 GM-% IV SOLN
1.0000 g | Freq: Three times a day (TID) | INTRAVENOUS | Status: DC
  Administered 2012-09-05 – 2012-09-07 (×5): 1 g via INTRAVENOUS
  Filled 2012-09-05 (×7): qty 50

## 2012-09-05 MED ORDER — IBUPROFEN 600 MG PO TABS: 600.0000 mg | ORAL_TABLET | Freq: Four times a day (QID) | ORAL | Status: DC | PRN

## 2012-09-05 MED ORDER — SODIUM BICARBONATE 8.4 % IV SOLN
INTRAVENOUS | Status: DC | PRN
  Administered 2012-09-05: 10 mL via EPIDURAL

## 2012-09-05 MED ORDER — LACTATED RINGERS IV SOLN
500.0000 mL | Freq: Once | INTRAVENOUS | Status: AC
  Administered 2012-09-05: 500 mL via INTRAVENOUS

## 2012-09-05 MED ORDER — MORPHINE SULFATE 0.5 MG/ML IJ SOLN
INTRAMUSCULAR | Status: AC
  Filled 2012-09-05: qty 10

## 2012-09-05 MED ORDER — 0.9 % SODIUM CHLORIDE (POUR BTL) OPTIME
TOPICAL | Status: DC | PRN
  Administered 2012-09-05: 400 mL

## 2012-09-05 MED ORDER — ACETAMINOPHEN 325 MG PO TABS: 650.0000 mg | ORAL_TABLET | ORAL | Status: DC | PRN

## 2012-09-05 MED ORDER — OXYTOCIN 40 UNITS IN LACTATED RINGERS INFUSION - SIMPLE MED
62.5000 mL/h | INTRAVENOUS | Status: DC
  Filled 2012-09-05: qty 1000

## 2012-09-05 MED ORDER — SODIUM BICARBONATE 8.4 % IV SOLN
INTRAVENOUS | Status: AC
  Filled 2012-09-05: qty 50

## 2012-09-05 MED ORDER — FENTANYL CITRATE 0.05 MG/ML IJ SOLN
INTRAMUSCULAR | Status: AC
  Filled 2012-09-05: qty 2

## 2012-09-05 MED ORDER — CEFAZOLIN SODIUM-DEXTROSE 2-3 GM-% IV SOLR
2.0000 g | Freq: Once | INTRAVENOUS | Status: AC
  Administered 2012-09-05: 2 g via INTRAVENOUS
  Filled 2012-09-05: qty 50

## 2012-09-05 MED ORDER — MORPHINE SULFATE (PF) 0.5 MG/ML IJ SOLN
INTRAMUSCULAR | Status: DC | PRN
  Administered 2012-09-05: 1.5 mg via INTRAVENOUS

## 2012-09-05 MED ORDER — BUPIVACAINE HCL (PF) 0.5 % IJ SOLN
INTRAMUSCULAR | Status: DC | PRN
  Administered 2012-09-05: 30 mL

## 2012-09-05 MED ORDER — HYDROXYZINE HCL 50 MG PO TABS: 50.0000 mg | ORAL_TABLET | Freq: Four times a day (QID) | ORAL | Status: DC | PRN

## 2012-09-05 MED ORDER — OXYCODONE-ACETAMINOPHEN 5-325 MG PO TABS: 1.0000 | ORAL_TABLET | ORAL | Status: DC | PRN

## 2012-09-05 MED ORDER — PHENYLEPHRINE 40 MCG/ML (10ML) SYRINGE FOR IV PUSH (FOR BLOOD PRESSURE SUPPORT)
PREFILLED_SYRINGE | INTRAVENOUS | Status: AC
  Filled 2012-09-05: qty 10

## 2012-09-05 MED ORDER — EPHEDRINE 5 MG/ML INJ
10.0000 mg | INTRAVENOUS | Status: DC | PRN
  Filled 2012-09-05: qty 4

## 2012-09-05 MED ORDER — OXYTOCIN BOLUS FROM INFUSION: 500.0000 mL | INTRAVENOUS | Status: DC

## 2012-09-05 MED ORDER — PHENYLEPHRINE 40 MCG/ML (10ML) SYRINGE FOR IV PUSH (FOR BLOOD PRESSURE SUPPORT)
80.0000 ug | PREFILLED_SYRINGE | INTRAVENOUS | Status: DC | PRN
  Filled 2012-09-05: qty 5

## 2012-09-05 MED ORDER — ONDANSETRON HCL 4 MG/2ML IJ SOLN
INTRAMUSCULAR | Status: DC | PRN
  Administered 2012-09-05: 4 mg via INTRAVENOUS

## 2012-09-05 MED ORDER — OXYTOCIN 10 UNIT/ML IJ SOLN
40.0000 [IU] | INTRAVENOUS | Status: DC | PRN
  Administered 2012-09-05: 40 [IU] via INTRAVENOUS

## 2012-09-05 MED ORDER — LIDOCAINE-EPINEPHRINE (PF) 2 %-1:200000 IJ SOLN
INTRAMUSCULAR | Status: AC
  Filled 2012-09-05: qty 20

## 2012-09-05 SURGICAL SUPPLY — 36 items
BARRIER ADHS 3X4 INTERCEED (GAUZE/BANDAGES/DRESSINGS) IMPLANT
CLOTH BEACON ORANGE TIMEOUT ST (SAFETY) ×2 IMPLANT
CONTAINER PREFILL 10% NBF 15ML (MISCELLANEOUS) IMPLANT
DRAPE LG THREE QUARTER DISP (DRAPES) ×2 IMPLANT
DRESSING TELFA 8X3 (GAUZE/BANDAGES/DRESSINGS) IMPLANT
DRSG OPSITE POSTOP 4X10 (GAUZE/BANDAGES/DRESSINGS) ×2 IMPLANT
DURAPREP 26ML APPLICATOR (WOUND CARE) ×2 IMPLANT
ELECT REM PT RETURN 9FT ADLT (ELECTROSURGICAL) ×2
ELECTRODE REM PT RTRN 9FT ADLT (ELECTROSURGICAL) ×1 IMPLANT
GAUZE SPONGE 4X4 12PLY STRL LF (GAUZE/BANDAGES/DRESSINGS) IMPLANT
GLOVE BIO SURGEON STRL SZ 6.5 (GLOVE) ×4 IMPLANT
GOWN PREVENTION PLUS LG XLONG (DISPOSABLE) ×6 IMPLANT
KIT ABG SYR 3ML LUER SLIP (SYRINGE) ×2 IMPLANT
NEEDLE HYPO 25X5/8 SAFETYGLIDE (NEEDLE) ×2 IMPLANT
NEEDLE SPNL 18GX3.5 QUINCKE PK (NEEDLE) ×2 IMPLANT
NS IRRIG 1000ML POUR BTL (IV SOLUTION) ×2 IMPLANT
PACK C SECTION WH (CUSTOM PROCEDURE TRAY) ×2 IMPLANT
PAD ABD 7.5X8 STRL (GAUZE/BANDAGES/DRESSINGS) IMPLANT
PAD OB MATERNITY 4.3X12.25 (PERSONAL CARE ITEMS) ×2 IMPLANT
SLEEVE SCD COMPRESS KNEE MED (MISCELLANEOUS) ×2 IMPLANT
STRIP CLOSURE SKIN 1/2X4 (GAUZE/BANDAGES/DRESSINGS) ×2 IMPLANT
SUT PDS AB 0 CTX 60 (SUTURE) IMPLANT
SUT VIC AB 0 CT1 27 (SUTURE) ×2
SUT VIC AB 0 CT1 27XBRD ANBCTR (SUTURE) ×2 IMPLANT
SUT VIC AB 0 CT1 36 (SUTURE) IMPLANT
SUT VIC AB 2-0 CT1 27 (SUTURE) ×1
SUT VIC AB 2-0 CT1 TAPERPNT 27 (SUTURE) ×1 IMPLANT
SUT VIC AB 2-0 CTX 36 (SUTURE) ×4 IMPLANT
SUT VIC AB 3-0 CT1 27 (SUTURE) ×1
SUT VIC AB 3-0 CT1 TAPERPNT 27 (SUTURE) ×1 IMPLANT
SUT VIC AB 3-0 SH 27 (SUTURE)
SUT VIC AB 3-0 SH 27X BRD (SUTURE) IMPLANT
SYR 30ML LL (SYRINGE) ×2 IMPLANT
TOWEL OR 17X24 6PK STRL BLUE (TOWEL DISPOSABLE) ×4 IMPLANT
TRAY FOLEY CATH 14FR (SET/KITS/TRAYS/PACK) IMPLANT
WATER STERILE IRR 1000ML POUR (IV SOLUTION) ×2 IMPLANT

## 2012-09-05 NOTE — Anesthesia Preprocedure Evaluation (Signed)

## 2012-09-05 NOTE — MAU Note (Signed)
Pt reports having vaginla bleeding that started today. Reports having some cramping that stared on the way to the hospital. Gets care at [facility].

## 2012-09-05 NOTE — Progress Notes (Addendum)
Shannon Diaz is a 22 y.o. G2P1001 at [redacted]w[redacted]d admitted for pprom Called to room by RN d/t decels  Subjective: Feeling some lower abdominal pain  Objective: BP 142/74  Pulse 131  Temp 97.8 F (36.6 C) (Oral)  Resp 18  Ht 5' (1.524 m)  Wt 86.183 kg (190 lb)  BMI 37.11 kg/m2  SpO2 99%  Breastfeeding? Unknown      FHT:  FHR: 160 bpm, variability: minimal ,  accelerations:  Abscent,  decelerations:  Present prolonged and variables- recurrent UC:   regular, every 2-3 minutes SVE:   6/70/-2 Mod amount dark red bleeding O2 @ 10l/min via nrbm placed, LR bolus infusing  Called Dr. Marice Potter- to bedside to evaluate Terbutaline 0.25mg  [place] given Decision for PLTCS Interpreter present  Labs: Lab Results  Component Value Date   WBC 13.6* 09/05/2012   HGB 11.4* 09/05/2012   HCT 33.8* 09/05/2012   MCV 72.4* 09/05/2012   PLT 366 09/05/2012    Assessment / Plan: spontaneous labor, progressing normally- presumed partial marginal abruption now w/ fetal distress  Labor: Progressing normally Preeclampsia:  n/a Fetal Wellbeing:  Category II, still has minimal variablity Pain Control:  Epidural I/D:  ancef for gbs pos Anticipated MOD:  urgent c/s for presumed partial marginal abruption w/ fetal distress  Marge Duncans 09/05/2012, 11:19 PM

## 2012-09-05 NOTE — Progress Notes (Signed)
Kasandra Moranda Billiot is a 22 y.o. G2P1001 at [redacted]w[redacted]d admitted for pprom  Subjective: Comfortable w/ epidural  Objective: BP 140/83  Pulse 105  Temp 97.8 F (36.6 C) (Oral)  Resp 18  Ht 5' (1.524 m)  Wt 86.183 kg (190 lb)  BMI 37.11 kg/m2  SpO2 99%  Breastfeeding? Unknown      FHT:  FHR: 140 bpm, variability: min-mod,  accelerations:  Present,  decelerations:  Absent UC:   irregular, every 1-5 minutes SVE:   Dilation: 5 Effacement (%): 50 Station: -3 Exam by:: Joellyn Haff vtx Large amount of bloody fluid  Labs: Lab Results  Component Value Date   WBC 13.6* 09/05/2012   HGB 11.4* 09/05/2012   HCT 33.8* 09/05/2012   MCV 72.4* 09/05/2012   PLT 366 09/05/2012    Assessment / Plan: PPROM, will continue expectant management for now- if no cervical change at next sve- will begin pitocin   Labor: no cervical change at this time Preeclampsia:  n/a Fetal Wellbeing:  Category II Pain Control:  Epidural I/D:  ancef for gbs pos (pcn allergic) Anticipated MOD:  NSVD  Discussed w/ Dr. Janene Harvey, Cheron Every 09/05/2012, 8:56 PM

## 2012-09-05 NOTE — Progress Notes (Signed)
Shannon Diaz is a 22 y.o. G2P1001 at [redacted]w[redacted]d admitted for PROM  Subjective: Comfortable, has received 1 dose of iv pain meds that worked well. FOB supportive at bs.  Objective: BP 128/81  Pulse 104  Temp 98.3 F (36.8 C) (Oral)  Resp 18  Ht 5' (1.524 m)  Wt 86.183 kg (190 lb)  BMI 37.11 kg/m2  Breastfeeding? Unknown      FHT:  FHR: 150 bpm, variability: moderate,  accelerations:  Present,  decelerations:  Present occ mild variables UC:   irregular, every 3-6 minutes SVE:   Dilation: 5 Effacement (%): 80 Station: -2 Exam by:: Valentina Lucks, RN @ [phone]  Labs: Lab Results  Component Value Date   WBC 13.6* 09/05/2012   HGB 11.4* 09/05/2012   HCT 33.8* 09/05/2012   MCV 72.4* 09/05/2012   PLT 366 09/05/2012    Assessment / Plan: Spontaneous labor, progressing normally  Labor: Progressing normally Preeclampsia:  n/a Fetal Wellbeing:  Category II Pain Control:  iv pain meds I/D:  ancef for gbs pos, pcn allergic Anticipated MOD:  NSVD  Marge Duncans 09/05/2012, 6:26 PM

## 2012-09-05 NOTE — Anesthesia Procedure Notes (Signed)
Epidural Patient location during procedure: OB Start time: 09/05/2012 7:54 PM End time: 09/05/2012 7:58 PM  Staffing Anesthesiologist: Sandrea Hughs Performed by: anesthesiologist   Preanesthetic Checklist Completed: patient identified, site marked, surgical consent, pre-op evaluation, timeout performed, IV checked, risks and benefits discussed and monitors and equipment checked  Epidural Patient position: sitting Prep: site prepped and draped and DuraPrep Patient monitoring: continuous pulse ox and blood pressure Approach: midline Injection technique: LOR air  Needle:  Needle type: Tuohy  Needle gauge: 17 G Needle length: 9 cm and 9 Needle insertion depth: 5 cm cm Catheter type: closed end flexible Catheter size: 19 Gauge Catheter at skin depth: 10 cm Test dose: negative and Other  Assessment Sensory level: T9 Events: blood not aspirated, injection not painful, no injection resistance, negative IV test and no paresthesia  Additional Notes Reason for block:procedure for pain

## 2012-09-05 NOTE — H&P (Signed)
Shannon Diaz is a 22 y.o. G87P1001 female at [redacted]w[redacted]d by 22wk u/s, presenting with report of gush of bloody fluid at app 1030 this am, occasional bilateral lower abdominal pains going into legs and back x 3 days, and decreased fm this am- but now normal since in [place].   Regular pnc at [facility] beginning at 22.3wks.  Positive for HgbS trait, fob status unknown. GBS bacteriuria @ 22wks. Treated for trichomonas during pregnancy. Anatomy scan wnl. States 2hr glucola wnl- results not on chart.   Maternal Medical History:  Reason for admission: Reason for admission: rupture of membranes.  Contractions: Onset was 3-5 hours ago.    Fetal activity: Perceived fetal activity is normal.   Last perceived fetal movement was within the past hour.   Had been decreased today before coming to [place], since in [place] fm has been normal  Prenatal complications: Preterm labor.   Late onset care @ 22.3wks Trichomonas during pregnancy GBS bacteruria +HgbS trait, fob unknown  Prenatal Complications - Diabetes: none.    OB History    Grav Para Term Preterm Abortions TAB SAB Ect Mult Living   2 1 1  0 0 0 0 0 0 1     Past Medical History  Diagnosis Date  . No pertinent past medical history    Past Surgical History  Procedure Date  . No past surgeries    Family History: family history is not on file. Social History:  reports that she has never smoked. She has never used smokeless tobacco. She reports that she does not drink alcohol or use illicit drugs.   Prenatal Transfer Tool  Maternal Diabetes: No Genetic Screening: Declined Maternal Ultrasounds/Referrals: Normal Fetal Ultrasounds or other Referrals:  None Maternal Substance Abuse:  No Significant Maternal Medications:  None Significant Maternal Lab Results:  Lab values include: Group B Strep positive, HgbS trait pos-fob unknown Other Comments:  late care @ 22wks  Review of Systems  Constitutional: Negative.   HENT: Negative.   Eyes:  Negative.   Respiratory: Negative.   Cardiovascular: Negative.   Gastrointestinal: Positive for abdominal pain (occasional lower abdominal pain on bilateral sides, into legs x 3 days).  Genitourinary: Negative.   Musculoskeletal: Negative.   Skin: Negative.   Neurological: Negative.   Endo/Heme/Allergies: Negative.   Psychiatric/Behavioral: Negative.     Dilation: 4 Effacement (%): 70 Station: -2 Exam by:: K Booker CNM Blood pressure 118/69, pulse 111, temperature 98 F (36.7 C), temperature source Oral, resp. rate 18, height 5' (1.524 m), weight 86.274 kg (190 lb 3.2 oz). Maternal Exam:  Abdomen: Patient reports no abdominal tenderness. Fetal presentation: vertex  Introitus: Normal vulva. Normal vagina.  Ferning test: positive.  Amniotic fluid character: bloody.  Pelvis: adequate for delivery.   Cervix: Cervix evaluated by sterile speculum exam and digital exam.     Fetal Exam Fetal Monitor Review: Mode: ultrasound.   Baseline rate: 150.  Variability: minimal (<5 bpm).   Pattern: accelerations present and no decelerations.    Fetal State Assessment: Category I - tracings are normal.    UCs: 2-4, not perceived by pt  Physical Exam  Constitutional: She is oriented to person, place, and time. She appears well-developed and well-nourished.  HENT:  Head: Normocephalic.  Neck: Normal range of motion.  Cardiovascular: Normal rate and regular rhythm.   Respiratory: Effort normal and breath sounds normal.  GI: Soft. Bowel sounds are normal.       gravid  Genitourinary: Vagina normal and uterus normal.  Mucousy bloody fluid on perineum Spec exam: Large amount of mucousy bloody fluid, + pooling of fluid SVE: 4/70/-2, vtx, soft  Musculoskeletal: Normal range of motion.  Neurological: She is alert and oriented to person, place, and time. She has normal reflexes.  Skin: Skin is warm and dry.  Psychiatric: She has a normal mood and affect. Her behavior is normal.  Judgment and thought content normal.    Prenatal labs: ABO, Rh:  A+ Antibody:  neg Rubella:  immune RPR:   neg HBsAg:   neg HIV:   neg GBS:   pos in urine  Assessment/Plan: A:  [redacted]w[redacted]d SIUP  PPROM  Cat I FHR  GBS pos   P:  Admit to BS  Ancef 2gm iv x 1, then 1gm q 8hrs for gbs pos  IV pain meds/epidural at pt request  Expectant management  Anticipate nsvd     Marge Duncans 09/05/2012, 1:50 PM

## 2012-09-05 NOTE — Progress Notes (Signed)
I was called to evaluate the fetal heart tracing. Recurrent deep decelerations. Pit stopped, terb given. She continues to have a vaginal bleeding consistent with an abruption. Her cervix is 5-6 cm.  A/P. Category 3 tracing in a patient remote from delivery. With the aid of an interpretor, I explained my recommendation for a cesarean section. She and her husband were in agreement. All questions were answered. Anesthesia and OR were notified.

## 2012-09-06 ENCOUNTER — Encounter (HOSPITAL_COMMUNITY): Payer: Self-pay | Admitting: *Deleted

## 2012-09-06 LAB — CBC
HCT: 27.5 % — ABNORMAL LOW (ref 36.0–46.0)
Platelets: 268 10*3/uL (ref 150–400)
RDW: 16 % — ABNORMAL HIGH (ref 11.5–15.5)
WBC: 14.5 10*3/uL — ABNORMAL HIGH (ref 4.0–10.5)

## 2012-09-06 MED ORDER — NALOXONE HCL 0.4 MG/ML IJ SOLN: 0.4000 mg | INTRAMUSCULAR | Status: DC | PRN

## 2012-09-06 MED ORDER — OXYCODONE-ACETAMINOPHEN 5-325 MG PO TABS
1.0000 | ORAL_TABLET | ORAL | Status: DC | PRN
  Administered 2012-09-06 – 2012-09-07 (×6): 1 via ORAL
  Filled 2012-09-06 (×6): qty 1

## 2012-09-06 MED ORDER — ZOLPIDEM TARTRATE 5 MG PO TABS: 5.0000 mg | ORAL_TABLET | Freq: Every evening | ORAL | Status: DC | PRN

## 2012-09-06 MED ORDER — IBUPROFEN 600 MG PO TABS
600.0000 mg | ORAL_TABLET | Freq: Four times a day (QID) | ORAL | Status: DC
  Administered 2012-09-06 – 2012-09-08 (×8): 600 mg via ORAL
  Filled 2012-09-06 (×8): qty 1

## 2012-09-06 MED ORDER — HYDROMORPHONE HCL PF 1 MG/ML IJ SOLN
1.0000 mg | Freq: Once | INTRAMUSCULAR | Status: AC
  Administered 2012-09-06: 1 mg via INTRAVENOUS
  Filled 2012-09-06: qty 1

## 2012-09-06 MED ORDER — LACTATED RINGERS IV SOLN
INTRAVENOUS | Status: DC
  Administered 2012-09-06: 09:00:00 via INTRAVENOUS

## 2012-09-06 MED ORDER — PROMETHAZINE HCL 25 MG/ML IJ SOLN: 6.2500 mg | INTRAMUSCULAR | Status: DC | PRN

## 2012-09-06 MED ORDER — DIBUCAINE 1 % RE OINT: 1.0000 "application " | TOPICAL_OINTMENT | RECTAL | Status: DC | PRN

## 2012-09-06 MED ORDER — SODIUM CHLORIDE 0.9 % IJ SOLN: 3.0000 mL | INTRAMUSCULAR | Status: DC | PRN

## 2012-09-06 MED ORDER — SENNOSIDES-DOCUSATE SODIUM 8.6-50 MG PO TABS
2.0000 | ORAL_TABLET | Freq: Every day | ORAL | Status: DC
  Administered 2012-09-06 – 2012-09-07 (×2): 2 via ORAL

## 2012-09-06 MED ORDER — KETOROLAC TROMETHAMINE 30 MG/ML IJ SOLN: 30.0000 mg | Freq: Four times a day (QID) | INTRAMUSCULAR | Status: AC | PRN

## 2012-09-06 MED ORDER — HYDROMORPHONE HCL PF 1 MG/ML IJ SOLN: 0.2500 mg | INTRAMUSCULAR | Status: DC | PRN

## 2012-09-06 MED ORDER — ONDANSETRON HCL 4 MG PO TABS: 4.0000 mg | ORAL_TABLET | ORAL | Status: DC | PRN

## 2012-09-06 MED ORDER — SIMETHICONE 80 MG PO CHEW: 80.0000 mg | CHEWABLE_TABLET | ORAL | Status: DC | PRN

## 2012-09-06 MED ORDER — SCOPOLAMINE 1 MG/3DAYS TD PT72
1.0000 | MEDICATED_PATCH | Freq: Once | TRANSDERMAL | Status: AC
  Administered 2012-09-06: 1.5 mg via TRANSDERMAL

## 2012-09-06 MED ORDER — TETANUS-DIPHTH-ACELL PERTUSSIS 5-2.5-18.5 LF-MCG/0.5 IM SUSP
0.5000 mL | Freq: Once | INTRAMUSCULAR | Status: AC
  Administered 2012-09-06: 0.5 mL via INTRAMUSCULAR

## 2012-09-06 MED ORDER — KETOROLAC TROMETHAMINE 60 MG/2ML IM SOLN
INTRAMUSCULAR | Status: AC
  Filled 2012-09-06: qty 2

## 2012-09-06 MED ORDER — NALBUPHINE HCL 10 MG/ML IJ SOLN
5.0000 mg | INTRAMUSCULAR | Status: DC | PRN
  Filled 2012-09-06: qty 1

## 2012-09-06 MED ORDER — OXYTOCIN 40 UNITS IN LACTATED RINGERS INFUSION - SIMPLE MED: 62.5000 mL/h | INTRAVENOUS | Status: AC

## 2012-09-06 MED ORDER — ONDANSETRON HCL 4 MG/2ML IJ SOLN: 4.0000 mg | INTRAMUSCULAR | Status: DC | PRN

## 2012-09-06 MED ORDER — KETOROLAC TROMETHAMINE 60 MG/2ML IM SOLN
60.0000 mg | Freq: Once | INTRAMUSCULAR | Status: AC | PRN
  Administered 2012-09-06: 60 mg via INTRAMUSCULAR

## 2012-09-06 MED ORDER — MENTHOL 3 MG MT LOZG: 1.0000 | LOZENGE | OROMUCOSAL | Status: DC | PRN

## 2012-09-06 MED ORDER — DIPHENHYDRAMINE HCL 50 MG/ML IJ SOLN: 12.5000 mg | INTRAMUSCULAR | Status: DC | PRN

## 2012-09-06 MED ORDER — ONDANSETRON HCL 4 MG/2ML IJ SOLN: 4.0000 mg | Freq: Three times a day (TID) | INTRAMUSCULAR | Status: DC | PRN

## 2012-09-06 MED ORDER — MEPERIDINE HCL 25 MG/ML IJ SOLN
INTRAMUSCULAR | Status: AC
  Filled 2012-09-06: qty 1

## 2012-09-06 MED ORDER — NALOXONE HCL 1 MG/ML IJ SOLN
1.0000 ug/kg/h | INTRAVENOUS | Status: DC | PRN
  Filled 2012-09-06: qty 2

## 2012-09-06 MED ORDER — LANOLIN HYDROUS EX OINT: 1.0000 "application " | TOPICAL_OINTMENT | CUTANEOUS | Status: DC | PRN

## 2012-09-06 MED ORDER — MEPERIDINE HCL 25 MG/ML IJ SOLN: 6.2500 mg | INTRAMUSCULAR | Status: DC | PRN

## 2012-09-06 MED ORDER — DIPHENHYDRAMINE HCL 25 MG PO CAPS
25.0000 mg | ORAL_CAPSULE | ORAL | Status: DC | PRN
  Administered 2012-09-06: 25 mg via ORAL
  Filled 2012-09-06: qty 1

## 2012-09-06 MED ORDER — DIPHENHYDRAMINE HCL 25 MG PO CAPS: 25.0000 mg | ORAL_CAPSULE | Freq: Four times a day (QID) | ORAL | Status: DC | PRN

## 2012-09-06 MED ORDER — SIMETHICONE 80 MG PO CHEW
80.0000 mg | CHEWABLE_TABLET | Freq: Three times a day (TID) | ORAL | Status: DC
  Administered 2012-09-06 – 2012-09-07 (×7): 80 mg via ORAL

## 2012-09-06 MED ORDER — KETOROLAC TROMETHAMINE 30 MG/ML IJ SOLN: 15.0000 mg | Freq: Once | INTRAMUSCULAR | Status: DC | PRN

## 2012-09-06 MED ORDER — SCOPOLAMINE 1 MG/3DAYS TD PT72
MEDICATED_PATCH | TRANSDERMAL | Status: AC
  Filled 2012-09-06: qty 1

## 2012-09-06 MED ORDER — PRENATAL MULTIVITAMIN CH
1.0000 | ORAL_TABLET | Freq: Every day | ORAL | Status: DC
  Administered 2012-09-07 – 2012-09-08 (×2): 1 via ORAL
  Filled 2012-09-06 (×2): qty 1

## 2012-09-06 MED ORDER — METOCLOPRAMIDE HCL 5 MG/ML IJ SOLN: 10.0000 mg | Freq: Three times a day (TID) | INTRAMUSCULAR | Status: DC | PRN

## 2012-09-06 MED ORDER — DIPHENHYDRAMINE HCL 50 MG/ML IJ SOLN: 25.0000 mg | INTRAMUSCULAR | Status: DC | PRN

## 2012-09-06 MED ORDER — WITCH HAZEL-GLYCERIN EX PADS: 1.0000 "application " | MEDICATED_PAD | CUTANEOUS | Status: DC | PRN

## 2012-09-06 MED ORDER — MEPERIDINE HCL 25 MG/ML IJ SOLN
6.2500 mg | INTRAMUSCULAR | Status: DC | PRN
  Administered 2012-09-06: 6.25 mg via INTRAVENOUS

## 2012-09-06 NOTE — Op Note (Signed)
09/05/2012 - 09/06/2012  12:02 AM  PATIENT:  Shannon Diaz  22 y.o. female  PRE-OPERATIVE DIAGNOSIS: 36 weeks, abruption, non-reassuring fetal heart rate  POST-OPERATIVE DIAGNOSIS:  same  PROCEDURE:  Procedure(s) (LRB) with comments: CESAREAN SECTION (N/A) - Primary cesarean section of baby boy at 2342 APGAR 7/8  SURGEON:  Surgeon(s) and Role:    * Allie Bossier, MD - Primary  PHYSICIAN ASSISTANT:   ASSISTANTS: none   ANESTHESIA:   epidural  EBL:  Total I/O In: 1300 [I.V.:1300] Out: 750 [Urine:50; Blood:700]  BLOOD ADMINISTERED:none  DRAINS: none   LOCAL MEDICATIONS USED:  MARCAINE     SPECIMEN:  Source of Specimen:  placenta  DISPOSITION OF SPECIMEN:  PATHOLOGY  COUNTS:  YES  TOURNIQUET:  * No tourniquets in log *  DICTATION: .Dragon Dictation  PLAN OF CARE: Admit to inpatient   PATIENT DISPOSITION:  PACU - hemodynamically stable.   Delay start of Pharmacological VTE agent (>24hrs) due to surgical blood loss or risk of bleeding: not applicable  The risks, benefits, and alternatives of surgery were explained, understood, accepted. Consents were signed. All questions were answered. Her epidural was bolus for surgery. Her abdomen was prepped and draped in the usual sterile fashion. A Foley catheter was  placed, draining clear urine throughout case. Timeout procedure was done. After adequate anesthesia was assured 30 mL for 0.5% Marcaine was injected into the subcutaneous tissue about 2 cm above the symphysis pubis. An incision was made at that site. The incision was carried down through the subcutaneous tissue to the fascia. The fascia was scored the midline and extended bilaterally. The middle 30% of the rectus muscles were separated in a transverse fashion using electrosurgical technique. Excellent hemostasis was maintained. The peritoneum was entered with hemostats. Peritoneal incision was extended bilaterally with the Bovie. The bladder blade was placed. A  transverse incision was made on the well-developed lower uterine segment. The uterine incision was extended with bandage scissors on each side. Amniotomy was performed with a hemostat. Bloody amniotic fluid was noted. The baby was delivered from a vertex presentation. The mouth and nostrils were suctioned prior to delivery of the shoulders. . The baby's cord was clamped and cut, and baby was transferred to the NICU personnel for routine care. The placenta was delivered intact with traction. There was no blood in the cord to obtain any sample. The entire placenta appeared to have very little blood in it. The uterus was left in situ, and the interior was cleaned with a dry lap sponge. The uterine incision was closed with 2-0 Vicryl running locking suture. Excellent hemostasis was noted. By tilting the uterus each side was able to visualize the adnexa, and they were normal. The rectus fascia rectus muscles were noted be hemostatic as well. The fascia was closed with a 0 Vicryl suture in a running nonlocking fashion. No defects were palpable. The subcutaneous tissue was irrigated, clean, and dried. A subcuticular closure was done with a 3-0 Vicryl suture. Steri-Strips are placed. Excellent cosmetic results were obtained. She was taken to the recovery room in stable condition. She tolerated the procedure well.

## 2012-09-06 NOTE — OR Nursing (Signed)
75 ml blood loss during fundal massage by DLWegner RN, unable to obtain cord blood

## 2012-09-06 NOTE — Anesthesia Postprocedure Evaluation (Signed)
Anesthesia Post Note  Patient: Shannon Diaz  Procedure(s) Performed: Procedure(s) (LRB): CESAREAN SECTION (N/A)  Anesthesia type: Epidural  Patient location: Mother/Baby  Post pain: Pain level controlled  Post assessment: Post-op Vital signs reviewed  Last Vitals:  Filed Vitals:   09/06/12 0633  BP: 121/74  Pulse: 106  Temp: 36.7 C  Resp: 22    Post vital signs: Reviewed  Level of consciousness:alert  Complications: No apparent anesthesia complications

## 2012-09-06 NOTE — Anesthesia Postprocedure Evaluation (Signed)
Anesthesia Post Note  Patient: Shannon Diaz  Procedure(s) Performed: Procedure(s) (LRB): CESAREAN SECTION (N/A)  Anesthesia type: Epidural  Patient location: PACU  Post pain: Pain level controlled  Post assessment: Post-op Vital signs reviewed  Last Vitals:  Filed Vitals:   09/06/12 0030  BP: 120/71  Pulse: 130  Temp:   Resp: 25    Post vital signs: Reviewed  Level of consciousness: awake  Complications: No apparent anesthesia complications

## 2012-09-06 NOTE — Transfer of Care (Signed)
Immediate Anesthesia Transfer of Care Note  Patient: Shannon Diaz  Procedure(s) Performed: Procedure(s) (LRB) with comments: CESAREAN SECTION (N/A) - Primary cesarean section of baby boy at 2342 APGAR 7/8  Patient Location: PACU  Anesthesia Type:Epidural  Level of Consciousness: awake, alert  and oriented  Airway & Oxygen Therapy: Patient Spontanous Breathing  Post-op Assessment: Report given to PACU RN and Post -op Vital signs reviewed and stable  Post vital signs: stable  Complications: No apparent anesthesia complications

## 2012-09-06 NOTE — Progress Notes (Signed)
Subjective: Postpartum Day 1: Cesarean Delivery Patient reports tolerating PO and + flatus. Ambulated x 1 w/out problems. No void yet. No pain.    Objective: Vital signs in last 24 hours: Temp:  [97.8 F (36.6 C)-99.2 F (37.3 C)] 98.8 F (37.1 C) (01/05 1456) Pulse Rate:  [87-131] 103  (01/05 1456) Resp:  [18-28] 20  (01/05 1456) BP: (102-173)/(45-143) 114/81 mmHg (01/05 1456) SpO2:  [94 %-100 %] 99 % (01/05 1456)  Physical Exam:  General: alert, cooperative and no distress Lochia: appropriate Uterine Fundus: firm Incision: Small amount of old blood marked on dressing.  DVT Evaluation: No cords or calf tenderness.   Basename 09/06/12 0515 09/05/12 1430  HGB 9.2* 11.4*  HCT 27.5* 33.8*    Assessment/Plan: Status post Cesarean section. Doing well postoperatively.  Continue current care. Breastfeeding. Plans Mirena or Depo. No circ.  Dorathy Kinsman 09/06/2012, 6:10 PM

## 2012-09-06 NOTE — Anesthesia Postprocedure Evaluation (Signed)
Anesthesia Post Note  Patient: Shannon Diaz  Procedure(s) Performed: Procedure(s) (LRB): CESAREAN SECTION (N/A)  Anesthesia type: Epidural  Patient location: Mother/Baby  Post pain: Pain level controlled  Post assessment: Post-op Vital signs reviewed  Last Vitals:  Filed Vitals:   09/06/12 0633  BP: 121/74  Pulse: 106  Temp: 36.7 C  Resp: 22    Post vital signs: Reviewed  Level of consciousness:alert  Complications: No apparent anesthesia complications  

## 2012-09-06 NOTE — Addendum Note (Signed)
Addendum  created 09/06/12 0731 by Algis Greenhouse, CRNA   Modules edited:Notes Section

## 2012-09-06 NOTE — Addendum Note (Signed)
Addendum  created 09/06/12 0734 by Algis Greenhouse, CRNA   Modules edited:Notes Section

## 2012-09-07 ENCOUNTER — Encounter (HOSPITAL_COMMUNITY): Payer: Self-pay | Admitting: Obstetrics & Gynecology

## 2012-09-07 NOTE — Progress Notes (Signed)
Post Partum Day 2 Subjective: no complaints, up ad lib, voiding, tolerating PO and + flatus  Objective: Blood pressure 125/82, pulse 103, temperature 98 F (36.7 C), temperature source Oral, resp. rate 18, height 5' (1.524 m), weight 86.183 kg (190 lb), SpO2 95.00%, unknown if currently breastfeeding.  Physical Exam:  General: alert and no distress Lochia: appropriate Uterine Fundus: firm Incision: healing well, no significant drainage, no significant erythema DVT Evaluation: No evidence of DVT seen on physical exam.   Basename 09/06/12 0515 09/05/12 1430  HGB 9.2* 11.4*  HCT 27.5* 33.8*    Assessment/Plan: Plan for discharge tomorrow, Breastfeeding and Contraception Mirena   LOS: 2 days   Tawnya Crook 09/07/2012, 7:33 AM

## 2012-09-07 NOTE — Progress Notes (Signed)
Ur chart review completed.  

## 2012-09-08 LAB — TYPE AND SCREEN: Unit division: 0

## 2012-09-08 MED ORDER — IBUPROFEN 600 MG PO TABS: 600.0000 mg | ORAL_TABLET | Freq: Four times a day (QID) | ORAL | Status: DC

## 2012-09-08 MED ORDER — FERROUS SULFATE 325 (65 FE) MG PO TABS: 325.0000 mg | ORAL_TABLET | Freq: Every day | ORAL | Status: DC

## 2012-09-08 MED ORDER — DOCUSATE SODIUM 100 MG PO CAPS: 100.0000 mg | ORAL_CAPSULE | Freq: Two times a day (BID) | ORAL | Status: DC | PRN

## 2012-09-08 MED ORDER — OXYCODONE-ACETAMINOPHEN 5-325 MG PO TABS: 1.0000 | ORAL_TABLET | ORAL | Status: DC | PRN

## 2012-09-08 NOTE — Discharge Summary (Signed)
Obstetric Discharge Summary  Shannon Diaz Shannon Diaz is a 22 y.o. [id number] presenting at 36w 1d with PPROM. She was admitted to L&D and progressed in labor. However, she was taken for urgent cesarean section due to fetal distress and presumed placental abruption.    Reason for Admission: PPROM Prenatal Procedures: none Intrapartum Procedures: cesarean: low cervical, transverse Postpartum Procedures: none Complications-Operative and Postpartum: none Hemoglobin  Date Value Range Status  09/06/2012 9.2* 12.0 - 15.0 g/dL Final     REPEATED TO VERIFY     DELTA CHECK NOTED     HCT  Date Value Range Status  09/06/2012 27.5* 36.0 - 46.0 % Final    Physical Exam:  General: alert, cooperative and no distress Lochia: appropriate Uterine Fundus: firm Incision: healing well, no significant drainage, no dehiscence, no significant erythema DVT Evaluation: No evidence of DVT seen on physical exam. Negative Homan's sign. No cords or calf tenderness. No significant calf/ankle edema.  Discharge Diagnoses: PPROM, placental abruption, preterm labor and delivery, s/p primary low transverse cesarean section, postpartum anemia  Discharge Information: Date: 09/08/2012 Activity: pelvic rest Diet: routine Medications: PNV, Ibuprofen, Colace, Iron and Percocet Condition: stable Instructions: refer to practice specific booklet Discharge to: home   Newborn Data: Live born female  Birth Weight: 6 lb 10 oz (3005 g) APGAR: 7, 8  Home with mother.  Napoleon Form 09/08/2012, 6:26 AM

## 2012-11-18 ENCOUNTER — Telehealth: Payer: Self-pay | Admitting: *Deleted

## 2012-11-19 ENCOUNTER — Other Ambulatory Visit: Payer: Self-pay

## 2012-11-23 ENCOUNTER — Encounter (HOSPITAL_COMMUNITY): Payer: Self-pay | Admitting: Emergency Medicine

## 2012-11-23 ENCOUNTER — Emergency Department (HOSPITAL_COMMUNITY)
Admission: EM | Admit: 2012-11-23 | Discharge: 2012-11-24 | Disposition: A | Discharge status: Home / Self Care | Payer: Medicaid Other | Attending: Emergency Medicine | Admitting: Emergency Medicine

## 2012-11-23 DIAGNOSIS — S39011A Strain of muscle, fascia and tendon of abdomen, initial encounter: Secondary | ICD-10-CM

## 2012-11-23 DIAGNOSIS — IMO0002 Reserved for concepts with insufficient information to code with codable children: Secondary | ICD-10-CM | POA: Insufficient documentation

## 2012-11-23 DIAGNOSIS — X500XXA Overexertion from strenuous movement or load, initial encounter: Secondary | ICD-10-CM | POA: Insufficient documentation

## 2012-11-23 DIAGNOSIS — Y9389 Activity, other specified: Secondary | ICD-10-CM | POA: Insufficient documentation

## 2012-11-23 DIAGNOSIS — Z862 Personal history of diseases of the blood and blood-forming organs and certain disorders involving the immune mechanism: Secondary | ICD-10-CM | POA: Insufficient documentation

## 2012-11-23 DIAGNOSIS — X503XXA Overexertion from repetitive movements, initial encounter: Secondary | ICD-10-CM | POA: Insufficient documentation

## 2012-11-23 DIAGNOSIS — Z3202 Encounter for pregnancy test, result negative: Secondary | ICD-10-CM | POA: Insufficient documentation

## 2012-11-23 DIAGNOSIS — Y9289 Other specified places as the place of occurrence of the external cause: Secondary | ICD-10-CM | POA: Insufficient documentation

## 2012-11-23 DIAGNOSIS — Z8619 Personal history of other infectious and parasitic diseases: Secondary | ICD-10-CM | POA: Insufficient documentation

## 2012-11-23 LAB — URINALYSIS, ROUTINE W REFLEX MICROSCOPIC
Glucose, UA: NEGATIVE mg/dL
Hgb urine dipstick: NEGATIVE
Ketones, ur: NEGATIVE mg/dL
Protein, ur: NEGATIVE mg/dL
pH: 5.5 (ref 5.0–8.0)

## 2012-11-23 LAB — BASIC METABOLIC PANEL
BUN: 11 mg/dL (ref 6–23)
CO2: 23 mEq/L (ref 19–32)
Calcium: 8.8 mg/dL (ref 8.4–10.5)
Creatinine, Ser: 0.6 mg/dL (ref 0.50–1.10)
GFR calc non Af Amer: >90 mL/min (ref >90)
Glucose, Bld: 100 mg/dL — ABNORMAL HIGH (ref 70–99)

## 2012-11-23 LAB — CBC
HCT: 33.7 % — ABNORMAL LOW (ref 36.0–46.0)
Hemoglobin: 11.3 g/dL — ABNORMAL LOW (ref 12.0–15.0)
MCH: 23.8 pg — ABNORMAL LOW (ref 26.0–34.0)
MCHC: 33.5 g/dL (ref 30.0–36.0)
MCV: 70.9 fL — ABNORMAL LOW (ref 78.0–100.0)
RBC: 4.75 MIL/uL (ref 3.87–5.11)

## 2012-11-23 LAB — URINE MICROSCOPIC-ADD ON

## 2012-11-23 NOTE — ED Notes (Signed)
Patient complaining of abdominal pain that started today.  Reports having c-section two months ago.  Reports took acetaminophen; provided little to no relief.  Denies nausea, vomiting, and diarrhea.

## 2012-11-23 NOTE — ED Notes (Signed)
Pt stated that 2 months ago she had a c section and now is experiencing pain in her abdomen. She denies an temperature.

## 2012-11-23 NOTE — Telephone Encounter (Signed)
Note given to pt. Per Jannette Spanner, she said Drenda Freeze wrote it.

## 2012-11-24 MED ORDER — IBUPROFEN 600 MG PO TABS: 600.0000 mg | ORAL_TABLET | Freq: Four times a day (QID) | ORAL | Status: DC | PRN

## 2012-11-24 NOTE — ED Provider Notes (Signed)
History     CSN: [id number]  Arrival date & time 11/23/12  1912   First MD Initiated Contact with Patient 11/23/12 2345      Chief Complaint  Patient presents with  . 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   Denies discharge, dysuria, dishence of the wound   Patient is a 22 y.o. female presenting with abdominal pain. The history is provided by the patient. The history is limited by a language barrier. A language interpreter was used.  Abdominal Pain Pain location:  Suprapubic Pain quality: tearing   Pain radiates to:  Does not radiate Pain severity:  Mild Onset quality:  Gradual Duration:  7 hours Progression:  Improving Chronicity:  New Associated symptoms: no chills, no diarrhea, no dysuria, no fever, no nausea, no vaginal bleeding, no vaginal discharge and no vomiting     Past Medical History  Diagnosis Date  . No pertinent past medical history   . Trichomonas vaginalis (TV) infection     During pregnancy  . Sickle cell trait     HgbS trait positive  . Late prenatal care     22 weeks    Past Surgical History  Procedure Laterality Date  . No past surgeries    . Cesarean section  09/05/2012    Procedure: CESAREAN SECTION;  Surgeon: Allie Bossier, MD;  Location: [facility];  Service: Obstetrics;  Laterality: N/A;  Primary cesarean section of baby boy at 2342 APGAR 7/8    History reviewed. No pertinent family history.  History  Substance Use Topics  . Smoking status: Never Smoker   . Smokeless tobacco: Never Used  . Alcohol Use: No    OB History   Grav Para Term Preterm Abortions TAB SAB Ect Mult Living   2 2 1 1  0 0 0 0 0 2      Review of Systems  Constitutional: Negative for fever and chills.   Gastrointestinal: Positive for abdominal pain. Negative for nausea, vomiting and diarrhea.  Genitourinary: Negative for dysuria, vaginal bleeding and vaginal discharge.  Skin: Negative for pallor.  All other systems reviewed and are negative.    Allergies  Penicillins  Home Medications   Current Outpatient Rx  Name  Route  Sig  Dispense  Refill  . acetaminophen (TYLENOL) 500 MG tablet   Oral   Take 1,000 mg by mouth every 6 (six) hours as needed for pain.           BP 116/74  Pulse 91  Temp(Src) 98.1 F (36.7 C) (Oral)  Resp 14  SpO2 100%  LMP 10/29/2012  Physical Exam  Nursing note and vitals reviewed. Constitutional: She is oriented to person, place, and time. She appears well-developed and well-nourished.  HENT:  Head: Normocephalic.  Eyes: Pupils are equal, round, and reactive to light.  Neck: Normal range of motion.  Cardiovascular: Normal rate.   Abdominal: Soft. Bowel sounds are normal. She exhibits no distension. There is no tenderness.  Well healed surgical scar without herniation  Musculoskeletal: Normal range of motion.  Neurological: She is alert and oriented to person, place, and time.  Skin: Skin is warm and dry.    ED Course  Procedures (including critical care time)  Labs Reviewed  CBC - Abnormal;  Notable for the following:    Hemoglobin 11.3 (*)    HCT 33.7 (*)    MCV 70.9 (*)    MCH 23.8 (*)    RDW 16.9 (*)    All other components within normal limits  BASIC METABOLIC PANEL - Abnormal; Notable for the following:    Glucose, Bld 100 (*)    All other components within normal limits  URINALYSIS, ROUTINE W REFLEX MICROSCOPIC - Abnormal; Notable for the following:    APPearance CLOUDY (*)    Leukocytes, UA MODERATE (*)    All other components within normal limits  URINE MICROSCOPIC-ADD ON - Abnormal; Notable for the following:    Squamous Epithelial / LPF MANY (*)    Bacteria, UA MANY (*)    All other components within normal limits   URINE CULTURE  LIPASE, BLOOD  POCT PREGNANCY, URINE   No results found.   1. Abdominal muscle strain, initial encounter       MDM  Labs reviewed-- normal most likely muscle strain         Arman Filter, NP 11/24/12 [phone]

## 2012-11-24 NOTE — ED Provider Notes (Signed)
Medical screening examination/treatment/procedure(s) were performed by non-physician practitioner and as supervising physician I was immediately available for consultation/collaboration.   Anam Bobby L Liliya Fullenwider, MD 11/24/12 0304 

## 2012-11-25 LAB — URINE CULTURE

## 2012-11-26 ENCOUNTER — Other Ambulatory Visit: Payer: Self-pay

## 2012-11-26 ENCOUNTER — Encounter: Payer: Self-pay | Admitting: Advanced Practice Midwife

## 2012-12-01 ENCOUNTER — Encounter: Payer: Self-pay | Admitting: *Deleted

## 2012-12-02 ENCOUNTER — Other Ambulatory Visit: Payer: Self-pay

## 2012-12-02 ENCOUNTER — Encounter: Payer: Self-pay | Admitting: Advanced Practice Midwife

## 2013-12-15 ENCOUNTER — Emergency Department (HOSPITAL_COMMUNITY)
Admission: EM | Admit: 2013-12-15 | Discharge: 2013-12-15 | Disposition: A | Discharge status: Home / Self Care | Payer: Self-pay | Attending: Emergency Medicine | Admitting: Emergency Medicine

## 2013-12-15 ENCOUNTER — Emergency Department (HOSPITAL_COMMUNITY): Payer: Medicaid Other

## 2013-12-15 ENCOUNTER — Encounter (HOSPITAL_COMMUNITY): Payer: Self-pay | Admitting: Emergency Medicine

## 2013-12-15 DIAGNOSIS — R748 Abnormal levels of other serum enzymes: Secondary | ICD-10-CM | POA: Insufficient documentation

## 2013-12-15 DIAGNOSIS — Z79899 Other long term (current) drug therapy: Secondary | ICD-10-CM | POA: Insufficient documentation

## 2013-12-15 DIAGNOSIS — Z8619 Personal history of other infectious and parasitic diseases: Secondary | ICD-10-CM | POA: Insufficient documentation

## 2013-12-15 DIAGNOSIS — Z880 Allergy status to penicillin: Secondary | ICD-10-CM | POA: Insufficient documentation

## 2013-12-15 DIAGNOSIS — D573 Sickle-cell trait: Secondary | ICD-10-CM | POA: Insufficient documentation

## 2013-12-15 DIAGNOSIS — O034 Incomplete spontaneous abortion without complication: Secondary | ICD-10-CM | POA: Insufficient documentation

## 2013-12-15 DIAGNOSIS — R110 Nausea: Secondary | ICD-10-CM | POA: Insufficient documentation

## 2013-12-15 DIAGNOSIS — O039 Complete or unspecified spontaneous abortion without complication: Secondary | ICD-10-CM

## 2013-12-15 DIAGNOSIS — R51 Headache: Secondary | ICD-10-CM | POA: Insufficient documentation

## 2013-12-15 LAB — URINE MICROSCOPIC-ADD ON

## 2013-12-15 LAB — URINALYSIS, ROUTINE W REFLEX MICROSCOPIC
BILIRUBIN URINE: NEGATIVE
GLUCOSE, UA: NEGATIVE mg/dL
KETONES UR: 15 mg/dL — AB
Nitrite: NEGATIVE
PROTEIN: 30 mg/dL — AB
Specific Gravity, Urine: 1.015 (ref 1.005–1.030)
Urobilinogen, UA: 0.2 mg/dL (ref 0.0–1.0)
pH: 5.5 (ref 5.0–8.0)

## 2013-12-15 LAB — COMPREHENSIVE METABOLIC PANEL
ALK PHOS: 55 U/L (ref 39–117)
ALT: 55 U/L — AB (ref 0–35)
AST: 43 U/L — AB (ref 0–37)
Albumin: 3.6 g/dL (ref 3.5–5.2)
BILIRUBIN TOTAL: 0.2 mg/dL — AB (ref 0.3–1.2)
BUN: 7 mg/dL (ref 6–23)
CALCIUM: 9.1 mg/dL (ref 8.4–10.5)
CHLORIDE: 104 meq/L (ref 96–112)
CO2: 21 meq/L (ref 19–32)
Creatinine, Ser: 0.54 mg/dL (ref 0.50–1.10)
GFR calc Af Amer: >90 mL/min (ref >90)
Glucose, Bld: 94 mg/dL (ref 70–99)
POTASSIUM: 4 meq/L (ref 3.7–5.3)
SODIUM: 139 meq/L (ref 137–147)
Total Protein: 8.2 g/dL (ref 6.0–8.3)

## 2013-12-15 LAB — WET PREP, GENITAL
CLUE CELLS WET PREP: NONE SEEN
Trich, Wet Prep: NONE SEEN
Yeast Wet Prep HPF POC: NONE SEEN

## 2013-12-15 LAB — CBC WITH DIFFERENTIAL/PLATELET
BASOS ABS: 0 10*3/uL (ref 0.0–0.1)
Basophils Relative: 0 % (ref 0–1)
EOS ABS: 0.1 10*3/uL (ref 0.0–0.7)
EOS PCT: 1 % (ref 0–5)
HEMATOCRIT: 37.2 % (ref 36.0–46.0)
HEMOGLOBIN: 12.9 g/dL (ref 12.0–15.0)
Lymphocytes Relative: 22 % (ref 12–46)
Lymphs Abs: 2.8 10*3/uL (ref 0.7–4.0)
MCH: 27.4 pg (ref 26.0–34.0)
MCHC: 34.7 g/dL (ref 30.0–36.0)
MCV: 79.1 fL (ref 78.0–100.0)
MONO ABS: 0.8 10*3/uL (ref 0.1–1.0)
MONOS PCT: 6 % (ref 3–12)
NEUTROS PCT: 71 % (ref 43–77)
Neutro Abs: 9 10*3/uL — ABNORMAL HIGH (ref 1.7–7.7)
Platelets: 332 10*3/uL (ref 150–400)
RBC: 4.7 MIL/uL (ref 3.87–5.11)
RDW: 14.1 % (ref 11.5–15.5)
WBC: 12.7 10*3/uL — ABNORMAL HIGH (ref 4.0–10.5)

## 2013-12-15 LAB — HCG, QUANTITATIVE, PREGNANCY: hCG, Beta Chain, Quant, S: 48547 m[IU]/mL — ABNORMAL HIGH (ref <5)

## 2013-12-15 LAB — PREGNANCY, URINE: Preg Test, Ur: POSITIVE — AB

## 2013-12-15 MED ORDER — SODIUM CHLORIDE 0.9 % IV BOLUS (SEPSIS)
1000.0000 mL | Freq: Once | INTRAVENOUS | Status: AC
  Administered 2013-12-15: 1000 mL via INTRAVENOUS

## 2013-12-15 NOTE — ED Notes (Signed)
Pt transporting to [place], female NT present for [place]

## 2013-12-15 NOTE — ED Notes (Signed)
Pt states she took a pregnancy test two months ago because she had missed her period. This is her first period in two months. Pt c/o of HA. No abd cramps at this moment.

## 2013-12-15 NOTE — ED Provider Notes (Signed)
CSN: [id number]     Arrival date & time 12/15/13  1408 History   First MD Initiated Contact with Patient 12/15/13 1607     Chief Complaint  Patient presents with  . Abdominal Pain  . Vaginal Bleeding  . Back Pain   HPI  Shannon Diaz is a 23 y.o. female with a PMH of trichomonas who presents to the ED for evaluation of abdominal pain, back pain, vaginal bleeding. History was provided by the patient using the spanish interpreter . Patient states that she developed abdominal pain this morning around 8 AM. She describes it as a cramping sensation in her lower middle pelvis. She also has complaints of lower back pain. She states that she had onset of vaginal bleeding this morning with her abdominal pain and passed a very large mass. She denies significant vaginal bleeding. She states it did not resemble a large blood clot. Patient states she has not had a menstrual period in the past 2 months. She took a home pregnancy test which was negative. She denies any dysuria, hematuria, or vaginal discharge. She's not on any means of contraception. She is currently sexually active. She has a history of trichomonas infection. Has a history of ovarian cysts. G2 P2. Does not currently have an established OB/GYN. Patient states that after she passed the large mass she had resolution of her abdominal pain. She has no abdominal pain currently. She also states she had nausea earlier but did not have any vomiting. She had a few episodes of diarrhea yesterday however this resolved. No hematochezia. She denies any constipation. Patient has a chronic migraine headache which is similar to her headache today. She states she's had had a headache intermittently for the past 2 weeks. Patient states he's otherwise been feeling well with no fever, chills, chest pain, cough, weakness, lightheadedness or dizziness.   Past Medical History  Diagnosis Date  . No pertinent past medical history   . Trichomonas vaginalis (TV)  infection     During pregnancy  . Sickle cell trait     HgbS trait positive  . Late prenatal care     22 weeks   Past Surgical History  Procedure Laterality Date  . No past surgeries    . Cesarean section  09/05/2012    Procedure: CESAREAN SECTION;  Surgeon: Allie BossierMyra C Dove, MD;  Location: [facility];  Service: Obstetrics;  Laterality: N/A;  Primary cesarean section of baby boy at 2342 APGAR 7/8   Family History  Problem Relation Age of Onset  . Diabetes Other    History  Substance Use Topics  . Smoking status: Never Smoker   . Smokeless tobacco: Never Used  . Alcohol Use: No   OB History   Grav Para Term Preterm Abortions TAB SAB Ect Mult Living   2 2 1 1  0 0 0 0 0 2     Review of Systems  Constitutional: Negative for fever, chills, diaphoresis, appetite change and fatigue.  HENT: Negative for congestion and sore throat.   Respiratory: Negative for cough and shortness of breath.   Cardiovascular: Negative for chest pain and leg swelling.  Gastrointestinal: Positive for nausea, abdominal pain and diarrhea. Negative for vomiting and constipation.  Genitourinary: Positive for vaginal bleeding. Negative for dysuria, hematuria, flank pain, vaginal discharge, difficulty urinating, genital sores and vaginal pain.  Musculoskeletal: Positive for back pain. Negative for myalgias.  Skin: Negative for color change and wound.  Neurological: Positive for headaches. Negative for dizziness, weakness and  light-headedness.    Allergies  Penicillins  Home Medications   Prior to Admission medications   Medication Sig Start Date End Date Taking? Authorizing Provider  acetaminophen (TYLENOL) 500 MG tablet Take 1,000 mg by mouth every 6 (six) hours as needed for pain.    Historical Provider, MD  Prenatal Vit-Fe Fumarate-FA (PRENATAL MULTIVITAMIN) TABS Take 1 tablet by mouth daily at 12 noon.    Historical Provider, MD   BP 137/87  Pulse 116  Temp(Src) 98.8 F (37.1 C) (Oral)  Resp 16  Ht 5\' 1"   (1.549 m)  Wt 190 lb (86.183 kg)  BMI 35.92 kg/m2  SpO2 100%  LMP 12/15/2013  Filed Vitals:   12/15/13 1637 12/15/13 1645 12/15/13 1700 12/15/13 1800  BP: 125/92 126/83 117/62 119/73  Pulse:  111 105 93  Temp:      TempSrc:      Resp: 16     Height:      Weight:      SpO2: 100% 100% 100% 100%    Physical Exam  Nursing note and vitals reviewed. Constitutional: She is oriented to person, place, and time. She appears well-developed and well-nourished. No distress.  HENT:  Head: Normocephalic and atraumatic.  Right Ear: External ear normal.  Left Ear: External ear normal.  Mouth/Throat: Oropharynx is clear and moist.  Eyes: Conjunctivae are normal. Right eye exhibits no discharge. Left eye exhibits no discharge.  Neck: Normal range of motion. Neck supple.  Cardiovascular: Normal rate, regular rhythm, normal heart sounds and intact distal pulses.  Exam reveals no gallop and no friction rub.   No murmur heard. Pulmonary/Chest: Effort normal and breath sounds normal. No respiratory distress. She has no wheezes. She has no rales. She exhibits no tenderness.  Abdominal: Soft. Bowel sounds are normal. She exhibits no distension and no mass. There is no tenderness. There is no rebound and no guarding.  Musculoskeletal: Normal range of motion. She exhibits no edema and no tenderness.  No CVA, lumbar, or flank tenderness bilaterally  Neurological: She is alert and oriented to person, place, and time.  Skin: Skin is warm and dry. She is not diaphoretic.     ED Course  Procedures (including critical care time) Labs Review Labs Reviewed - No data to display  Imaging Review US Ob Comp Less 14 Wks  12/15/2013   CLINICAL DATA:  Vaginal bleeding and abdominal pain. Quantitative beta HCG pending. Estimate gestational age LMP is 8 weeks 3 days.  EXAM: TRANSVAGINAL OB ULTRASOUND; OBSTETRIC <14 WK ULTRASOUND  TECHNIQUE: Transvaginal ultrasound was performed for complete evaluation of the  gestation as well as the maternal uterus, adnexal regions, and pelvic cul-de-sac.  COMPARISON:  None.  FINDINGS: Intrauterine gestational sac: Not visualized.  Yolk sac:  Not visualized.  Embryo:  Not visualized.  Cardiac Activity: Not visualized.  Heart Rate: Not visualized. Bpm  MSD: Not visualized.  CRL:   Not visualized.  Maternal uterus/adnexae: Uterus demonstrates ill-defined heterogeneous thickening of the endometrium measuring approximately 2.5 cm. Ovaries are normal size, shape and position. There is trace free pelvic fluid.  IMPRESSION: Thickened somewhat ill-defined heterogeneous endometrium measuring approximately 2.5 cm in thickness. No evidence of intrauterine gestational sac. No evidence of adnexal abnormality. Recommend correlation with patient's quantitative beta HCG and follow-up as clinically indicated.   Electronically Signed   By: Elberta Fortis M.D.   On: 12/15/2013 19:17   US Ob Transvaginal  12/15/2013   CLINICAL DATA:  Vaginal bleeding and abdominal pain. Quantitative beta HCG  pending. Estimate gestational age LMP is 8 weeks 3 days.  EXAM: TRANSVAGINAL OB ULTRASOUND; OBSTETRIC <14 WK ULTRASOUND  TECHNIQUE: Transvaginal ultrasound was performed for complete evaluation of the gestation as well as the maternal uterus, adnexal regions, and pelvic cul-de-sac.  COMPARISON:  None.  FINDINGS: Intrauterine gestational sac: Not visualized.  Yolk sac:  Not visualized.  Embryo:  Not visualized.  Cardiac Activity: Not visualized.  Heart Rate: Not visualized. Bpm  MSD: Not visualized.  CRL:   Not visualized.  Maternal uterus/adnexae: Uterus demonstrates ill-defined heterogeneous thickening of the endometrium measuring approximately 2.5 cm. Ovaries are normal size, shape and position. There is trace free pelvic fluid.  IMPRESSION: Thickened somewhat ill-defined heterogeneous endometrium measuring approximately 2.5 cm in thickness. No evidence of intrauterine gestational sac. No evidence of adnexal  abnormality. Recommend correlation with patient's quantitative beta HCG and follow-up as clinically indicated.   Electronically Signed   By: Elberta Fortis M.D.   On: 12/15/2013 19:17     EKG Interpretation None      Results for orders placed during the hospital encounter of 12/15/13  WET PREP, GENITAL      Result Value Ref Range   Yeast Wet Prep HPF POC NONE SEEN  NONE SEEN   Trich, Wet Prep NONE SEEN  NONE SEEN   Clue Cells Wet Prep HPF POC NONE SEEN  NONE SEEN   WBC, Wet Prep HPF POC FEW (*) NONE SEEN  GC/CHLAMYDIA PROBE AMP      Result Value Ref Range   CT Probe RNA NEGATIVE  NEGATIVE   GC Probe RNA NEGATIVE  NEGATIVE  URINALYSIS, ROUTINE W REFLEX MICROSCOPIC      Result Value Ref Range   Color, Urine RED (*) YELLOW   APPearance CLOUDY (*) CLEAR   Specific Gravity, Urine 1.015  1.005 - 1.030   pH 5.5  5.0 - 8.0   Glucose, UA NEGATIVE  NEGATIVE mg/dL   Hgb urine dipstick LARGE (*) NEGATIVE   Bilirubin Urine NEGATIVE  NEGATIVE   Ketones, ur 15 (*) NEGATIVE mg/dL   Protein, ur 30 (*) NEGATIVE mg/dL   Urobilinogen, UA 0.2  0.0 - 1.0 mg/dL   Nitrite NEGATIVE  NEGATIVE   Leukocytes, UA SMALL (*) NEGATIVE  PREGNANCY, URINE      Result Value Ref Range   Preg Test, Ur POSITIVE (*) NEGATIVE  CBC WITH DIFFERENTIAL      Result Value Ref Range   WBC 12.7 (*) 4.0 - 10.5 K/uL   RBC 4.70  3.87 - 5.11 MIL/uL   Hemoglobin 12.9  12.0 - 15.0 g/dL   HCT 40.9  81.1 - 91.4 %   MCV 79.1  78.0 - 100.0 fL   MCH 27.4  26.0 - 34.0 pg   MCHC 34.7  30.0 - 36.0 g/dL   RDW 78.2  95.6 - 21.3 %   Platelets 332  150 - 400 K/uL   Neutrophils Relative % 71  43 - 77 %   Neutro Abs 9.0 (*) 1.7 - 7.7 K/uL   Lymphocytes Relative 22  12 - 46 %   Lymphs Abs 2.8  0.7 - 4.0 K/uL   Monocytes Relative 6  3 - 12 %   Monocytes Absolute 0.8  0.1 - 1.0 K/uL   Eosinophils Relative 1  0 - 5 %   Eosinophils Absolute 0.1  0.0 - 0.7 K/uL   Basophils Relative 0  0 - 1 %   Basophils Absolute 0.0  0.0 - 0.1 K/uL  COMPREHENSIVE METABOLIC PANEL      Result Value Ref Range   Sodium 139  137 - 147 mEq/L   Potassium 4.0  3.7 - 5.3 mEq/L   Chloride 104  96 - 112 mEq/L   CO2 21  19 - 32 mEq/L   Glucose, Bld 94  70 - 99 mg/dL   BUN 7  6 - 23 mg/dL   Creatinine, Ser 1.610.54  0.50 - 1.10 mg/dL   Calcium 9.1  8.4 - 09.610.5 mg/dL   Total Protein 8.2  6.0 - 8.3 g/dL   Albumin 3.6  3.5 - 5.2 g/dL   AST 43 (*) 0 - 37 U/L   ALT 55 (*) 0 - 35 U/L   Alkaline Phosphatase 55  39 - 117 U/L   Total Bilirubin 0.2 (*) 0.3 - 1.2 mg/dL   GFR calc non Af Amer >90  >90 mL/min   GFR calc Af Amer >90  >90 mL/min  HCG, QUANTITATIVE, PREGNANCY      Result Value Ref Range   hCG, Beta Chain, Quant, S 48547 (*) <5 mIU/mL  URINE MICROSCOPIC-ADD ON      Result Value Ref Range   Squamous Epithelial / LPF RARE  RARE   WBC, UA 0-2  <3 WBC/hpf   RBC / HPF TOO NUMEROUS TO COUNT  <3 RBC/hpf   Bacteria, UA RARE  RARE     MDM   Debbe MountsSugeyli Farley LyBonilla Diaz is a 23 y.o. female with a PMH of trichomonas who presents to the ED for evaluation of abdominal pain, back pain, vaginal bleeding   09/05/12 - Rh testing A positive   Rechecks  5:30 PM = Pelvic exam performed with RN present. Os closed. Minimal vaginal bleeding present in the vaginal vault. No CMT or adnexal tenderness bilaterally.  8:30 PM = Denies abdominal pain. No concerns. Informed patient of results. Repeat abdominal exam benign.    Etiology of abdominal pain likely due to a complete abortion. Patient had passage of fetal contents (examined picture taken by patient on her phone) PTA in the ED. Beta HCG 48,547. LNMP two months ago. US revealed no evidence of an intrauterine gestational sac. Pelvic exam revealed closed os and was otherwise unremarkable. Abdominal exam benign. Rh positive (previous records reviewed). Vital signs stable. Patient found to have mildly elevated liver enzymes. Informed to follow-up with PCP regarding this. Mild leukocytosis (12.7). Patient afebrile and  non-toxic in appearance. UA not highly suggestive of a UTI. Sent for culture. Will not treat at this time. [facility] and instructed to follow-up in two days for repeat Beta-HCG. Return precautions, discharge instructions, and follow-up was discussed with the patient before discharge.     Discharge Medication List as of 12/15/2013  8:32 PM      Final impressions: 1. Miscarriage   2. Elevated liver enzymes      Luiz IronJessica Katlin Laquentin Loudermilk PA-C   This patient was discussed with Dr. Karlton LemonHarrison        Amiayah Giebel K Keelie Zemanek, PA-C 12/16/13 1018

## 2013-12-15 NOTE — Discharge Instructions (Signed)
Drink fluids and rest  Follow-up with OB/GYN in two days for repeat testing - you likely had a miscarriage but still require close follow-up Your labs showed elevated liver enzymes - please follow-up with a primary doctor regarding this  Return to the emergency department if you develop any changing/worsening condition, repeated vomiting, increased vaginal bleeding, fever, severe abdominal pain, feeling like you are going to pass out, or any other concerns (please read additional information regarding your condition below)   Aborto espontneo  (Miscarriage) El aborto espontneo es la prdida de un beb que no ha nacido (feto) antes de la semana 20 del Psychiatristembarazo. La mayor parte de estos abortos ocurre en los primeros 3 meses. En algunos casos ocurre antes de que la mujer sepa que est [place] Landingembarazada. Tambin se denomina "aborto espontneo" o "prdida prematura del embarazo". El aborto espontneo puede ser Neomia Dearuna experiencia que afecte emocionalmente a Dealerla persona. Converse con su mdico si tiene dudas, cmo es el proceso de Mineralduelo, y sobre planes futuros de Psychiatristembarazo.  CAUSAS   Algunos problemas cromosmicos pueden hacer imposible que el beb se desarrolle normalmente. Los problemas con los genes o cromosomas del beb son generalmente el resultado de errores que se producen, por casualidad, cuando el embrin se divide y crece. Estos problemas no se heredan de los Owasapadres.  Infeccin en el cuello del tero.   Problemas hormonales.   Problemas en el cuello del tero, como tener un tero incompetente. Esto ocurre cuando los tejidos no son lo suficientemente fuertes como para Arts administratorcontener el embarazo.   Problemas del tero, como un tero con forma anormal, los fibromas o anormalidades congnitas.   Ciertas enfermedades crnicas.   No fume, no beba alcohol, ni consuma drogas.   Traumatismos  A veces, la causa es desconocida.  SNTOMAS   Sangrado o manchado vaginal, con o sin clicos o dolor.  Dolor o  clicos en el abdomen o en la cintura.  Eliminacin de lquido, tejidos o cogulos grandes por la vagina. DIAGNSTICO  El Office Depotmdico le har un examen fsico. Tambin le indicar una ecografa para confirmar el aborto. Es posible que se realicen anlisis de Braddock Heightssangre.  TRATAMIENTO   En algunos casos el tratamiento no es necesario, si se eliminan naturalmente todos los tejidos embrionarios que se encontraban en el tero. Si el feto o la placenta quedan dentro del tero (aborto incompleto), pueden infectarse, los tejidos que quedan pueden infectarse y deben retirarse. Generalmente se realiza un procedimiento de dilatacin y curetaje (D y C). Durante el procedimiento de dilatacin y curetaje, el cuello del tero se abre (dilata) y se retira cualquier resto de tejido fetal o placentario del tero.  Si hay una infeccin, le recetarn antibiticos. Podrn recetarle otros medicamentos para reducir el tamao del tero (contraerlo) si hay una mucho sangrado.  Si su sangre es Rh negativa y su beb es Rh positivo, usted necesitar la inyeccin de inmunoglobulina Rh. Esta inyeccin proteger a los futuros bebs de tener problemas de compatibilidad Rh en futuros embarazos. INSTRUCCIONES PARA EL CUIDADO EN EL HOGAR   El mdico le indicar reposo en cama o le permitir Dance movement psychotherapistrealizar actividades livianas. Vuelva a la actividad lentamente o segn las indicaciones de su mdico.  Pdale a alguien que la ayude con las responsabilidades familiares y del hogar durante este tiempo.   Lleve un registro de la cantidad y la saturacin de las toallas higinicas que Landscape architectutiliza cada da. Anote esta informacin   No use tampones. No No se haga duchas vaginales  ni tenga relaciones sexuales hasta que el mdico la autorice.   Slo tome medicamentos de venta libre o recetados para Primary school teacher o Environmental health practitioner, segn las indicaciones de su mdico.   No tome aspirina. La aspirina puede ocasionar hemorragias.   Concurra puntualmente a  las citas de control con el mdico.   Si usted o su pareja tienen dificultades con el duelo, hable con su mdico para buscar la [employer] ayude a Runner, broadcasting/film/video la prdida del Psychiatrist. Permtase el tiempo suficiente de duelo antes de quedar embarazada nuevamente.  SOLICITE ATENCIN MDICA DE INMEDIATO SI:   Siente calambres intensos o dolor en la espalda o en el abdomen.  Tiene fiebre.  Elimina grandes cogulos de [place] (del tamao de una nuez o ms) o tejidos por la vagina. Guarde lo que ha eliminado para que su mdico lo examine.   La hemorragia aumenta.   Brett Fairy secrecin vaginal espesa y con mal olor.  Se siente mareada, dbil, o se desmaya.   Siente escalofros.  ASEGRESE DE QUE:   Comprende estas instrucciones.  Controlar su enfermedad.  Solicitar ayuda de inmediato si no mejora o si empeora. Document Released: 05/29/2005 Document Revised: 12/14/2012 [facility] Patient Information 2014 [place], [place].  Miscarriage A miscarriage is the sudden loss of an unborn baby (fetus) before the 20th week of pregnancy. Most miscarriages happen in the first 3 months of pregnancy. Sometimes, it happens before a woman even knows she is pregnant. A miscarriage is also called a "spontaneous miscarriage" or "early pregnancy loss." Having a miscarriage can be an emotional experience. Talk with your caregiver about any questions you may have about miscarrying, the grieving process, and your future pregnancy plans. CAUSES   Problems with the fetal chromosomes that make it impossible for the baby to develop normally. Problems with the baby's genes or chromosomes are most often the result of errors that occur, by chance, as the embryo divides and grows. The problems are not inherited from the parents.  Infection of the cervix or uterus.   Hormone problems.   Problems with the cervix, such as having an incompetent cervix. This is when the tissue in the cervix is not  strong enough to hold the pregnancy.   Problems with the uterus, such as an abnormally shaped uterus, uterine fibroids, or congenital abnormalities.   Certain medical conditions.   Smoking, drinking alcohol, or taking illegal drugs.   Trauma.  Often, the cause of a miscarriage is unknown.  SYMPTOMS   Vaginal bleeding or spotting, with or without cramps or pain.  Pain or cramping in the abdomen or lower back.  Passing fluid, tissue, or blood clots from the vagina. DIAGNOSIS  Your caregiver will perform a physical exam. You may also have an ultrasound to confirm the miscarriage. Blood or urine tests may also be ordered. TREATMENT   Sometimes, treatment is not necessary if you naturally pass all the fetal tissue that was in the uterus. If some of the fetus or placenta remains in the body (incomplete miscarriage), tissue left behind may become infected and must be removed. Usually, a dilation and curettage (D and C) procedure is performed. During a D and C procedure, the cervix is widened (dilated) and any remaining fetal or placental tissue is gently removed from the uterus.  Antibiotic medicines are prescribed if there is an infection. Other medicines may be given to reduce the size of the uterus (contract) if there is a lot of bleeding.  If you  have Rh negative blood and your baby was Rh positive, you will need a Rh immunoglobulin shot. This shot will protect any future baby from having Rh blood problems in future pregnancies. HOME CARE INSTRUCTIONS   Your caregiver may order bed rest or may allow you to continue light activity. Resume activity as directed by your caregiver.  Have someone help with home and family responsibilities during this time.   Keep track of the number of sanitary pads you use each day and how soaked (saturated) they are. Write down this information.   Do not use tampons. Do not douche or have sexual intercourse until approved by your caregiver.    Only take over-the-counter or prescription medicines for pain or discomfort as directed by your caregiver.   Do not take aspirin. Aspirin can cause bleeding.   Keep all follow-up appointments with your caregiver.   If you or your partner have problems with grieving, talk to your caregiver or seek counseling to help cope with the pregnancy loss. Allow enough time to grieve before trying to get pregnant again.  SEEK IMMEDIATE MEDICAL CARE IF:   You have severe cramps or pain in your back or abdomen.  You have a fever.  You pass large blood clots (walnut-sized or larger) ortissue from your vagina. Save any tissue for your caregiver to inspect.   Your bleeding increases.   You have a thick, bad-smelling vaginal discharge.  You become lightheaded, weak, or you faint.   You have chills.  MAKE SURE YOU:  Understand these instructions.  Will watch your condition.  Will get help right away if you are not doing well or get worse. Document Released: 02/12/2001 Document Revised: 12/14/2012 Document Reviewed: 10/08/2011 [facility] Patient Information 2014 [place], [place].   Emergency Department Resource Guide 1) Find a Doctor and Pay Out of Pocket Although you won't have to find out who is covered by your insurance plan, it is a good idea to ask around and get recommendations. You will then need to call the office and see if the doctor you have chosen will accept you as a new patient and what types of options they offer for patients who are self-pay. Some doctors offer discounts or will set up payment plans for their patients who do not have insurance, but you will need to ask so you aren't surprised when you get to your appointment.  2) Contact Your Local Health Department Not all health departments have doctors that can see patients for sick visits, but many do, so it is worth a call to see if yours does. If you don't know where your local health department is, you can check in  your phone book. The CDC also has a tool to help you locate your state's health department, and many state websites also have listings of all of their local health departments.  3) Find a Walk-in Clinic If your illness is not likely to be very severe or complicated, you may want to try a walk in clinic. These are popping up all over the country in pharmacies, drugstores, and shopping centers. They're usually staffed by nurse practitioners or physician assistants that have been trained to treat common illnesses and complaints. They're usually fairly quick and inexpensive. However, if you have serious medical issues or chronic medical problems, these are probably not your best option.  No Primary Care Doctor: - Call Health Connect at  [phone] - they can help you locate a primary care doctor that  accepts your insurance,  provides certain services, etc. - Physician Referral Service- [phone]  Chronic Pain Problems: Organization         Address  Phone   Notes  [facility]  [phone] Patients need to be referred by their primary care doctor.   Medication Assistance: Organization         Address  Phone   Notes  [facility] Medication [facility] [address] [place] [place], [place] [address] [phone] --Must be a resident of [facility] -- Must have NO insurance coverage whatsoever (no Medicaid/ Medicare, etc.) -- The pt. MUST have a primary care doctor that directs their care regularly and follows them in the community   [facility]ist  [phone]   [employer]  [phone]    Agencies that provide inexpensive medical care: Organization         Address  Phone   Notes  [facility]  [phone]   [facility]    [phone]   [facility] [address] [place], [place] [address] [phone]   [facility] [address] [place], [place] [phone]    Planned Parenthood    [phone]   [facility]    [phone]   [facility]  [address], [place] Phone:  [phone], Fax:  [phone] Hours of Operation:  9 am - 6 pm, M-F.  Also accepts Medicaid/Medicare and self-pay.  [facility]  [address], [place] Phone: [phone], Fax: [phone]. Hours of Operation:  8:30 am - 5:30 pm, M-F.  Also accepts Medicaid and self-pay.  [facility] [place] [address], [place] Phone: [phone]   [facility] [address] Natasha Bence [place], [place] [phone], Ext. [phone] Mondays & Thursdays: 7-9 AM.  First 15 patients are seen on a first come, first serve basis.    Medicaid-accepting [facility] Providers:  Organization         Address  Phone   Notes  [facility] [address],  [phone] Also accepts self-pay patients.  [facility] [address] Laurell Josephs [place], [place]  [phone]   [facility] [address], [place] [phone]   [facility] [address] [place] [phone]   Renaye Rakers [address], [place]   [phone] Only accepts [place] Access [place] patients after they have their name applied to their card.   Self-Pay (no insurance) in [facility]:  Organization         Address  Phone   Notes  Sickle Cell Patients, [facility] [address] [place], [place] [phone]   [facility] [address] [place], [place] [phone]   [facility] [place]  [address], [place] [phone]   [facility]/Dr. Osei-Bonsu  [address], [place] or [address], [place] [phone] Phone number for both [place] and [place] locations is the  same.  [facility] [address], [facility] [phone]   [facility] [address], [place] or [address] [phone]   [facility] [address], [place] [phone], phone; [phone], fax Sees patients 1st and 3rd Saturday of every month.  Must not qualify for public or private insurance (i.e. Medicaid, Medicare, [place] Health Choice, Veterans' Benefits)  Household income should be no more than 200% of the poverty level The clinic cannot treat you if you are pregnant or think you are pregnant  Sexually transmitted diseases are not treated at the clinic.    Dental Care: Organization         Address  Phone  Notes  [facility] [address], [place] [phone] Accepts children up to age 23 who are enrolled in [place]icaid or [place] Health Choice; pregnant women with a Medicaid card; and children who have applied for Medicaid or [place] Health Choice, but were declined, whose parents can pay a reduced fee at time of service.  [facility] [place]  [address], [place] [phone] Accepts children up to age 23 who are enrolled in [place]icaid or [place] Health Choice; pregnant women with a Medicaid card; and children who have applied for Medicaid or  Health Choice, but were declined, whose parents can pay a reduced fee at time of service.  [facility]  [address], [place] [phone]) [phone] Patients are seen by appointment only. Walk-ins are not accepted. [facility] will see patients 218 years of age and older. Monday - Tuesday (8am-5pm) Most Wednesdays (8:30-5pm) $30 per visit, cash only  [facility]  [address], [facility] [place] [phone]) [phone] Patients are seen by appointment only. Walk-ins are not accepted. [facility] will see patients  23 years of age and older. One Wednesday Evening (Monthly: Volunteer Based).  $30 per visit, cash only  [employer] [facility]  [phone] for adults; Children under age 824, call Graduate Pediatric Dentistry at [phone]. Children aged 434-14, please call [phone] to request a pediatric application.  Dental services are provided in all areas of dental care including fillings, crowns and bridges, complete and partial dentures, implants, gum treatment, root canals, and extractions. Preventive care is also provided. Treatment is provided to both adults and children. Patients are selected via a lottery and there is often a waiting list.   [facility] [address], [place]  [phone] [email or web address]   [facility] [address], [place], [place] [phone], Ext. [phone] Second and Fourth Thursday of each month, opens at 6:30 AM; Clinic ends at 9 AM.  Patients are seen on a first-come first-served basis, and a limited number are seen during each clinic.   [facility]  [address], [place], [place] [phone]) [phone]   Eligibility Requirements You must have lived in [place], [place], or [place] counties for at least the last three months.   You cannot be eligible for state or federal sponsored National Cityhealthcare insurance, including CIGNAVeterans Administration, [place]icaid, or Harrah's EntertainmentMedicare.   You generally cannot be eligible for healthcare insurance through your employer.    How to apply: Eligibility screenings are held every Tuesday and Wednesday afternoon from 1:00 pm until 4:00 pm. You do not need an appointment for the interview!  [facility] [address], [place], [place] [phone]   [facility]  [phone]   [facility]  [phone]   [facility]  [phone]  Behavioral Health Resources in the Community: Intensive Outpatient  Programs Organization         Address  Phone  Notes  [facility] [address], [place], [place] [phone]   [facility] [address], [place], [place] [phone]   [facility] [address], [place], [place]  [phone]   [facility] [address],  [place], [place] [phone] or [phone]   Substance Abuse Resources Organization         Address  Phone  Notes  [facility]  [phone]   [facility]  [phone]   [facility]  [phone]   [facility]  [phone]   Residential & Outpatient Substance Abuse Program  [phone]   Psychological Services Organization         Address  Phone  Notes  [facility]  [phone]- [phone]   [facility]  [phone]- [phone]   [facility] [address], [place] 701 [phone] or [phone]    Mobile Crisis Teams Organization         Address  Phone  Notes  [facility], Mobile Crisis Care Unit  [phone]   [facility]  [address] [place], [place] [phone]   Doristine LocksSharon DeEsch [address] [place] [phone]    Self-Help/Support Groups Organization         Address  Phone             Notes  [facility] [place] - variety of support groups  336- [phone] Call for more information  Narcotics Anonymous (NA), [facility] [address], [place]  2 meetings at this location   Statisticianesidential Treatment Programs Organization         Address  Phone  Notes  [facility] [address],    [place]  [phone]   [facility]  [address], [place] [phone], [place], [place] [phone]   [facility] [address], [place] [phone] Admissions: 8am-3pm M-F  [facility] [address]    [place], [place]  [phone]   [facility] [address], [place], [place] [phone]   [facility] [address]  [place], [place] [phone]   [facility] - Intensive Outpatient [address], [place], [place] [phone]   [facility] ([facility] [address]  [place], [place] [phone] or [phone]   [facility] ([facility]) [address] [place], [place] [phone] Accepts Medicaid  [facility] [address]  [place] [phone] Substance Abuse/Addiction Treatment   [facility] Resources Organization         Address  Phone  Notes  [facility]  [phone]   Angie FavaJulie Brannon, PhD [address], [address] [place], [place]   [phone] or [phone]   [facility]   [address] [place], [place] [phone]   [facility] [address], [place], [place] [phone]) [phone] Insurance/Medicaid/sponsorship through [facility]  [facility] Families [address]                                    [place], [place] [phone]) [phone] Therapy/tele-psych/case  [facility] [address]  [place], [place] [phone]    Dr. Adele Schilder  [phone]   [facility] Dept. 1) [address] [place] 2) [place] 3)  [place] [address], [place] [phone]  [phone]   [place] [phone] or [phone] (After Hours)

## 2013-12-15 NOTE — ED Notes (Signed)
Pt states she's having her menstrual cycle.  Pt reports that she is passing a lot of clots which is abnormal for her.  Pt also c/o abdominal pain and lower back pain rated 5/10.  Interpreter phone used for triage.

## 2013-12-16 LAB — GC/CHLAMYDIA PROBE AMP
CT Probe RNA: NEGATIVE
GC PROBE AMP APTIMA: NEGATIVE

## 2013-12-16 NOTE — ED Provider Notes (Signed)
Medical screening examination/treatment/procedure(s) were performed by non-physician practitioner and as supervising physician I was immediately available for consultation/collaboration.   EKG Interpretation None        Junius ArgyleForrest S Moncerrath Berhe, MD 12/16/13 1110

## 2013-12-17 LAB — URINE CULTURE

## 2014-07-04 ENCOUNTER — Encounter (HOSPITAL_COMMUNITY): Payer: Self-pay | Admitting: Emergency Medicine

## 2016-01-02 ENCOUNTER — Emergency Department (HOSPITAL_COMMUNITY)
Admission: EM | Admit: 2016-01-02 | Discharge: 2016-01-03 | Disposition: A | Discharge status: Home / Self Care | Payer: Medicaid Other | Attending: Emergency Medicine | Admitting: Emergency Medicine

## 2016-01-02 ENCOUNTER — Encounter (HOSPITAL_COMMUNITY): Payer: Self-pay

## 2016-01-02 DIAGNOSIS — R200 Anesthesia of skin: Secondary | ICD-10-CM | POA: Insufficient documentation

## 2016-01-02 DIAGNOSIS — M542 Cervicalgia: Secondary | ICD-10-CM | POA: Insufficient documentation

## 2016-01-02 DIAGNOSIS — R110 Nausea: Secondary | ICD-10-CM | POA: Insufficient documentation

## 2016-01-02 DIAGNOSIS — R519 Headache, unspecified: Secondary | ICD-10-CM

## 2016-01-02 DIAGNOSIS — Z880 Allergy status to penicillin: Secondary | ICD-10-CM | POA: Insufficient documentation

## 2016-01-02 DIAGNOSIS — H538 Other visual disturbances: Secondary | ICD-10-CM | POA: Insufficient documentation

## 2016-01-02 DIAGNOSIS — R51 Headache: Secondary | ICD-10-CM | POA: Insufficient documentation

## 2016-01-02 MED ORDER — DIPHENHYDRAMINE HCL 50 MG/ML IJ SOLN
25.0000 mg | Freq: Once | INTRAMUSCULAR | Status: AC
  Administered 2016-01-03: 25 mg via INTRAVENOUS
  Filled 2016-01-02: qty 1

## 2016-01-02 MED ORDER — KETOROLAC TROMETHAMINE 30 MG/ML IJ SOLN
30.0000 mg | Freq: Once | INTRAMUSCULAR | Status: AC
  Administered 2016-01-03: 30 mg via INTRAVENOUS
  Filled 2016-01-02: qty 1

## 2016-01-02 MED ORDER — METOCLOPRAMIDE HCL 5 MG/ML IJ SOLN
10.0000 mg | Freq: Once | INTRAMUSCULAR | Status: AC
  Administered 2016-01-03: 10 mg via INTRAVENOUS
  Filled 2016-01-02: qty 2

## 2016-01-02 MED ORDER — SODIUM CHLORIDE 0.9 % IV BOLUS (SEPSIS)
1000.0000 mL | Freq: Once | INTRAVENOUS | Status: AC
  Administered 2016-01-03: 1000 mL via INTRAVENOUS

## 2016-01-02 NOTE — ED Notes (Signed)
Patient presents stating about 930pm Monday night started with pain to the back of her neck that traveled up into her head.  The pain was pulsating in nature and occasionally her eye sight was blurry  Denies nausea at this time

## 2016-01-02 NOTE — ED Provider Notes (Signed)
CSN: [id number]     Arrival date & time 01/02/16  1647 History   First MD Initiated Contact with Patient 01/02/16 2244     Chief Complaint  Patient presents with  . Headache  . Nausea     (Consider location/radiation/quality/duration/timing/severity/associated sxs/prior Treatment) Patient is a 25 y.o. female presenting with headaches. The history is provided by the patient and a relative.  Headache Associated symptoms: myalgias, nausea, neck pain, numbness and photophobia   Associated symptoms: no abdominal pain, no back pain, no congestion, no cough, no diarrhea, no dizziness, no eye pain, no fever, no vomiting and no weakness    Patient is a 25 year old female who presents to emergency department with a headache for 1 day. She states the headache is at the top and back of her head, it is constant, non-radiating, but the intensity waxes and wanes from 6/10 to 10/10. She endorses associated nausea and mildly blurred vision when the headache is 10/10. She states the blurred vision has only occurred while watching television or driving and resolves without intervention. She has taken Tylenol for this headache which gave her minimal relief. She states a similar headache roughly 1 month ago that lasted one week then went away without intervention. She states an aching pain in her left shoulder and left arm for one day. She states associated intermittent numbness and tingling of her hand with this pain. She denies chest pain, shortness of breath, pain in her calves, recent surgery, recent travel, history of blood clots, weakness, facial drooping, dysphonia.   Past Medical History  Diagnosis Date  . No pertinent past medical history   . Trichomonas vaginalis (TV) infection     During pregnancy  . Sickle cell trait (HCC)     HgbS trait positive  . Late prenatal care     22 weeks   Past Surgical History  Procedure Laterality Date  . No past surgeries    . Cesarean section  09/05/2012     Procedure: CESAREAN SECTION;  Surgeon: Allie Bossier, MD;  Location: [facility];  Service: Obstetrics;  Laterality: N/A;  Primary cesarean section of baby boy at 2342 APGAR 7/8   Family History  Problem Relation Age of Onset  . Diabetes Other    Social History  Substance Use Topics  . Smoking status: Never Smoker   . Smokeless tobacco: Never Used  . Alcohol Use: No   OB History    Gravida Para Term Preterm AB TAB SAB Ectopic Multiple Living   0 0 0 0 0 2     Review of Systems  Constitutional: Negative for fever and chills.  HENT: Negative for congestion, facial swelling, tinnitus, trouble swallowing and voice change.   Eyes: Positive for photophobia and visual disturbance. Negative for pain and redness.  Respiratory: Negative for cough, chest tightness and shortness of breath.   Cardiovascular: Negative for chest pain and leg swelling.  Gastrointestinal: Positive for nausea. Negative for vomiting, abdominal pain, diarrhea, constipation, blood in stool and abdominal distention.  Genitourinary: Negative for dysuria and hematuria.  Musculoskeletal: Positive for myalgias and neck pain. Negative for back pain.  Skin: Negative for rash.  Neurological: Positive for numbness and headaches. Negative for dizziness, syncope, speech difficulty and weakness.  Psychiatric/Behavioral: Negative for confusion and agitation.      Allergies  Penicillins  Home Medications   Prior to Admission medications   Medication Sig Start Date End Date Taking? Authorizing Provider  acetaminophen (TYLENOL) 500 MG  tablet Take 1,000 mg by mouth every 6 (six) hours as needed for pain.   Yes Historical Provider, MD  etonogestrel (NEXPLANON) 68 MG IMPL implant 1 each by Subdermal route once.   Yes Historical Provider, MD   BP 117/70 mmHg  Pulse 84  Temp(Src) 98.5 F (36.9 C) (Oral)  Resp 18  Ht 5\' 1"  (1.549 m)  Wt 88.451 kg  BMI 36.86 kg/m2  SpO2 100% Physical Exam  Constitutional: She is oriented  to person, place, and time. She appears well-developed and well-nourished. No distress.  HENT:  Head: Normocephalic and atraumatic.  Eyes: Conjunctivae and EOM are normal. Pupils are equal, round, and reactive to light.  Visual acuity grossly intact, peripheral vision intact  Neck: Normal range of motion. Neck supple.  No deformities or step-off of the C-spine, tenderness to palpation of left paraspinal muscles of the C-spine, trapezius, and numbness in left hand reproduced with palpation of trapezius muscle, no tenderness to palpation of the C-spine  Cardiovascular: Normal rate, regular rhythm and normal heart sounds.   Pulmonary/Chest: Effort normal and breath sounds normal. No respiratory distress. She has no wheezes. She exhibits no tenderness.  Abdominal: Soft. She exhibits no distension. There is no tenderness.  Musculoskeletal: Normal range of motion. She exhibits no edema.  Full range of motion of bilateral upper and lower extremities, no deformities noted, strength 5/5 of plantar flexion and extension, of bilateral upper and lower cavities, TTP of the left deltoid and left forearm.  Neurological: She is alert and oriented to person, place, and time. Coordination normal.  Visual fields intact Speech is clear and goal oriented, follows commands Cranial nerves without deficit, no facial droop Normal strength in upper and lower extremities bilaterally including dorsiflexion and plantar flexion, strong and equal grip strength Sensation normal to light touch Moves extremities without ataxia, coordination intact Normal finger to nose  No pronator drift Normal gait Normal balance  Skin: Skin is warm and dry. No rash noted.  Psychiatric: She has a normal mood and affect. Her speech is normal and behavior is normal. Thought content normal. Cognition and memory are normal.  Nursing note and vitals reviewed.   ED Course  Procedures (including critical care time) Labs Review Labs Reviewed   CBC WITH DIFFERENTIAL/PLATELET - Abnormal; Notable for the following:    WBC 13.6 (*)    Neutro Abs 8.6 (*)    All other components within normal limits  I-STAT CHEM 8, ED - Abnormal; Notable for the following:    Potassium 5.4 (*)    BUN 21 (*)    Calcium, Ion 0.99 (*)    Hemoglobin 15.3 (*)    All other components within normal limits    Imaging Review No results found. I have personally reviewed and evaluated these images and lab results as part of my medical decision-making.   EKG Interpretation None      MDM   Final diagnoses:  Nonintractable headache, unspecified chronicity pattern, unspecified headache type    Patient with a headache for 1 day similar to a headache roughly 1 month ago with intermittent blurred vision. No neurological deficits on exam so less concerning for intracranial pathology. This is likely a migraine or a tension type headache. Patient's neck pain and arm pain was reproducible with palpation and the numbness in her left hand was reproducible with palpation of her trapezius muscle. Less concerning that her numbness is intercranial or spinal in nature and likely musculoskeletal in nature. Will give fluids and migraine  cocktail and reassess.  BUN, potassium, WBC, and HGB slightly elevated likely due to patient's dehydration. She states she has not been drinking much water 2/2 nausea. Patient was given fluids while in the ED. Patient is afebrile and not tachycardic without signs of infection so less concerning for infection with a white blood cell count of 13.6.  I instructed the patient to follow up with her primary care provider and have labs done to recheck these levels.  Patient's headache resolved and she has not had any episodes of blurred vision while in the ED. I instructed the patient to acquire a primary care provider and follow-up within 1 week. I discussed the possibility of this being pseudotumor cerebri although less likely since her pain was  relieved with a migraine cocktail. I discussed strict return precautions. I discussed all of the results with the patient and family members they have expressed their understanding to the verbal discharge instructions.      Jerre Simon, PA 01/03/16 0107  Laurence Spates, MD 01/07/16 [phone]

## 2016-01-02 NOTE — ED Notes (Signed)
Patient here with generalized headache, nausea, neck pain, bilateral blurred vision since late Sunday night. Reports hx of headaches. Denies trauma. Alert and oriented

## 2016-01-03 LAB — I-STAT CHEM 8, ED
BUN: 21 mg/dL — AB (ref 6–20)
CALCIUM ION: 0.99 mmol/L — AB (ref 1.12–1.23)
CHLORIDE: 106 mmol/L (ref 101–111)
Creatinine, Ser: 0.6 mg/dL (ref 0.44–1.00)
Glucose, Bld: 86 mg/dL (ref 65–99)
HCT: 45 % (ref 36.0–46.0)
Hemoglobin: 15.3 g/dL — ABNORMAL HIGH (ref 12.0–15.0)
POTASSIUM: 5.4 mmol/L — AB (ref 3.5–5.1)
Sodium: 138 mmol/L (ref 135–145)
TCO2: 22 mmol/L (ref 0–100)

## 2016-01-03 LAB — CBC WITH DIFFERENTIAL/PLATELET
Basophils Absolute: 0 10*3/uL (ref 0.0–0.1)
Basophils Relative: 0 %
EOS ABS: 0.2 10*3/uL (ref 0.0–0.7)
Eosinophils Relative: 2 %
HCT: 40.5 % (ref 36.0–46.0)
Hemoglobin: 13.7 g/dL (ref 12.0–15.0)
LYMPHS ABS: 4 10*3/uL (ref 0.7–4.0)
LYMPHS PCT: 29 %
MCH: 27.4 pg (ref 26.0–34.0)
MCHC: 33.8 g/dL (ref 30.0–36.0)
MCV: 81 fL (ref 78.0–100.0)
MONOS PCT: 6 %
Monocytes Absolute: 0.9 10*3/uL (ref 0.1–1.0)
Neutro Abs: 8.6 10*3/uL — ABNORMAL HIGH (ref 1.7–7.7)
Neutrophils Relative %: 63 %
Platelets: 377 10*3/uL (ref 150–400)
RBC: 5 MIL/uL (ref 3.87–5.11)
RDW: 14.2 % (ref 11.5–15.5)
WBC: 13.6 10*3/uL — AB (ref 4.0–10.5)

## 2016-01-03 NOTE — ED Notes (Signed)
Discharge instructions reviewed - voiced understanding 

## 2016-01-03 NOTE — Discharge Instructions (Signed)
Make an appointment with a primary care provider as early as tomorrow if possible to discuss your headaches.  Return to the emergency department if you experience worsening headaches, worsening blurred vision, vomiting, dizziness, you passed out, weakness.  Dolor de cabeza general sin causa (General Headache Without Cause) El dolor de cabeza es un dolor o malestar que se siente en la zona de la cabeza o del cuello. Puede no tener una causa especfica. Hay muchas causas y tipos de dolores de Turkmenistancabeza. Los dolores de cabeza ms comunes son los siguientes:  Cefalea tensional.  Cefaleas migraosas.  Cefalea en brotes.  Cefaleas diarias crnicas. INSTRUCCIONES PARA EL CUIDADO EN EL HOGAR  Controle su afeccin para ver si hay cambios. Siga estos pasos para Scientist, physiologicalcontrolar la afeccin: Control del Reynolds Americandolor  Tome los medicamentos de venta libre y los recetados solamente como se lo haya indicado el mdico.  Cuando sienta dolor de cabeza acustese en un cuarto oscuro y tranquilo.  Si se lo indican, aplique hielo sobre la cabeza y la zona del cuello:  Ponga el hielo en una bolsa plstica.  Coloque una toalla entre la piel y la bolsa de hielo.  Coloque el hielo durante 20minutos, 2 a 3veces por Futures traderda.  Utilice una almohadilla trmica o tome una ducha con agua caliente para aplicar calor en la cabeza y la zona del cuello como se lo haya indicado el mdico.  Mantenga las luces tenues si le Liz Claibornemolesta las luces brillantes o sus dolores de cabeza empeoran. Comida y bebida  Mantenga un horario para las comidas.  Limite el consumo de bebidas alcohlicas.  Consuma menos cantidad de cafena o deje de tomarla. Instrucciones generales  Concurra a todas las visitas de control como se lo haya indicado el mdico. Esto es importante.  Lleve un diario de los dolores de cabeza para Financial risk analystaveriguar qu factores pueden desencadenarlos. Por ejemplo, escriba los siguientes datos:  Lo que usted come y Estate agentbebe.  Cunto  tiempo duerme.  Algn cambio en su dieta o en los medicamentos.  Pruebe algunas tcnicas de relajacin, como los Brownsdalemasajes.  Limite el estrs.  Sintese con la espalda recta y no tense los msculos.  No consuma productos que contengan tabaco, incluidos cigarrillos, tabaco de Theatre managermascar o cigarrillos electrnicos. Si necesita ayuda para dejar de fumar, consulte al mdico.  Haga actividad fsica habitualmente como se lo haya indicado el mdico.  Tenga un horario fijo para dormir. Duerma entre 7 y 9horas o la cantidad de horas que le haya recomendado el mdico. SOLICITE ATENCIN MDICA SI:   Los medicamentos no Materials engineerlogran aliviar los sntomas.  Tiene un dolor de cabeza que es diferente del dolor de cabeza habitual.  Tiene nuseas o vmitos.  Tiene fiebre. SOLICITE ATENCIN MDICA DE INMEDIATO SI:   El dolor se hace cada vez ms intenso.  Ha vomitado repetidas veces.  Presenta rigidez en el cuello.  Sufre prdida de la visin.  Tiene problemas para hablar.  Siente dolor en el ojo o en el odo.  Presenta debilidad muscular o prdida del control muscular.  Pierde el equilibrio o tiene problemas para Advertising account plannercaminar.  Sufre mareos o se desmaya.  Se siente confundido.   Esta informacin no tiene Theme park managercomo fin reemplazar el consejo del mdico. Asegrese de hacerle al mdico cualquier pregunta que tenga.   Document Released: 05/29/2005 Document Revised: 05/10/2015 Elsevier Interactive Patient Education [employer]2016 Elsevier Inc.

## 2016-05-01 ENCOUNTER — Ambulatory Visit (INDEPENDENT_AMBULATORY_CARE_PROVIDER_SITE_OTHER): Payer: Worker's Compensation | Admitting: Physician Assistant

## 2016-05-01 VITALS — BP 130/80 | HR 61 | Temp 98.8°F | Resp 17 | Ht 61.0 in | Wt 193.0 lb

## 2016-05-01 DIAGNOSIS — R21 Rash and other nonspecific skin eruption: Secondary | ICD-10-CM | POA: Diagnosis not present

## 2016-05-01 MED ORDER — NEOMYCIN-POLYMYXIN-HC 3.5-10000-0.5 EX CREA: TOPICAL_CREAM | Freq: Two times a day (BID) | CUTANEOUS | 0 refills | Status: DC

## 2016-05-01 NOTE — Progress Notes (Deleted)
[facility] [address], [place] [address] [phone]  By signing my name below I, [name], attest that this documentation has been prepared under the direction and in the presence of Trena PlattStephanie English PA. Electonically Signed. [name], Scribe 05/01/2016 at 2:20 PM  Date:  05/01/2016   Name:  Shannon Diaz   DOB:  March 12, 1991   MRN:  [id number]  PCP:  No PCP Per Patient  Chief Complaint  Patient presents with   Other    rash on back      History of Present Illness:  Shannon Diaz is a 25 y.o. female patient [facility] rash noticed yesterday. Pt works for a [employer], [employer],  in the Arts administratorcleaning department. After cleaning a room yesterday she had itchiness to left lower back; she went to clean the same room today, and after doing so, she her entire body felt itchy. She notes bumps to neck and shoulder. She is unsure of bed bugs. States animals are allowed to room at hotel; room did not smell like animals yesterday.      There are no active problems to display for this patient.    Past Medical History:  Diagnosis Date   Late prenatal care    22 weeks   No pertinent past medical history    Sickle cell trait (HCC)    HgbS trait positive   Trichomonas vaginalis (TV) infection    During pregnancy    Past Surgical History:  Procedure Laterality Date   CESAREAN SECTION  09/05/2012   Procedure: CESAREAN SECTION;  Surgeon: Allie BossierMyra C Dove, MD;  Location: [facility];  Service: Obstetrics;  Laterality: N/A;  Primary cesarean section of baby boy at 2342 APGAR 7/8   NO PAST SURGERIES      Social History  Substance Use Topics   Smoking status: Never Smoker   Smokeless tobacco: Never Used   Alcohol use No    Family History  Problem Relation Age of Onset   Diabetes Other     Allergies  Allergen Reactions   Penicillins Swelling and Rash    Skin gets red, experiences swelling    Medication list has been  reviewed and updated.  No current outpatient prescriptions on file prior to visit.   No current facility-administered medications on file prior to visit.     Review of Systems  Constitutional: Negative for chills and fever.  Skin: Positive for itching and rash.   ROS unremarkable unless otherwise specified.  Physical Examination: BP 130/80 (BP Location: Right Arm, Patient Position: Sitting, Cuff Size: Large)    Pulse 61    Temp 98.8 F (37.1 C) (Oral)    Resp 17    Ht 5\' 1"  (1.549 m)    Wt 193 lb (87.5 kg)    SpO2 99%    BMI 36.47 kg/m  Ideal Body Weight: @FLOWAMB ([id number])@[phone])@  Physical Exam  Constitutional: She is oriented to person, place, and time. She appears well-developed and well-nourished. No distress.  HENT:  Head: Normocephalic and atraumatic.  Right Ear: External ear normal.  Left Ear: External ear normal.  Eyes: Conjunctivae and EOM are normal. Pupils are equal, round, and reactive to light.  Cardiovascular: Normal rate.   Pulmonary/Chest: Effort normal. No respiratory distress.  Neurological: She is alert and oriented to person, place, and time.  Skin: She is not diaphoretic.  Right lower back- Raised erythematous patch the was non tender.   Psychiatric: She  has a normal mood and affect. Her behavior is normal.     Assessment and Plan: Shannon Diaz is a 25 y.o. female who is here today  @DIAGMED @  Trena Platt, PA-C [facility] 05/01/2016 2:20 PM

## 2016-05-01 NOTE — Patient Instructions (Addendum)
     IF you received an x-ray today, you will receive an invoice from [facility]. Please contact [facility] at [phone] with questions or concerns regarding your invoice.   IF you received labwork today, you will receive an invoice from [facility]. Please contact [facility] at [phone] with questions or concerns regarding your invoice.   Our billing staff will not be able to assist you with questions regarding bills from these companies.  You will be contacted with the lab results as soon as they are available. The fastest way to get your results is to activate your My Chart account. Instructions are located on the last page of this paperwork. If you have not heard from us regarding the results in 2 weeks, please contact this office.     Erupcin cutnea (Rash)  Una erupcin es un cambio en el color o en la textura de la piel. Hay diferentes tipos de erupcin. Puede ser que tenga otros sntomas que acompaan la erupcin.  CAUSAS   Infecciones.  Reacciones alrgicas. Esto incluye alergias a mascotas o a medicamentos.  Ciertos medicamentos.  Exposicin a ciertas sustancias qumicas, jabones o cosmticos.  El calor.  Exposicin a plantas venenosas.  Tumores, tanto cancerosos como no cancerosos. SNTOMAS   Enrojecimiento.  Piel escamosa.  Picazn en la piel.  Piel seca o agrietada.  Bultos.  Ampollas.  Dolor. DIAGNSTICO  El mdico har un examen fsico para determinar qu tipo de erupcin tiene. Podrn tomarle una muestra de piel (biopsia) para ser examinada en el microscopio.  TRATAMIENTO  El tratamiento depende del tipo de erupcin que usted tenga. El mdico puede prescribirle algunos medicamentos. En los casos graves, Pension scheme managernecesitar ver a un mdico Engineer, productionespecialista en piel (dermatlogo).  INSTRUCCIONES PARA EL CUIDADO DOMICILIARIO  Evite las sustancias que han causado la erupcin.  No se rasque la lesin. Puede  ocasionarle una infeccin.  Tome baos con agua fresca para Psychologist, sport and exercisedetener la picazn.  Tome slo medicamentos de venta libre o recetados, segn las indicaciones del mdico.  Cumpla con todas las visitas de control, segn le indique su mdico. SOLICITE ATENCIN MDICA DE INMEDIATO SI:  El dolor, la hinchazn o el enrojecimiento Woodsburghaumentan.  Tiene fiebre.  Tiene sntomas nuevos o graves.  Siente dolor en el cuerpo, diarrea o vmitos.  La erupcin no mejora en el trmino de 3 das. ASEGRESE DE QUE:   Comprende estas instrucciones.  Controlar su enfermedad.  Solicitar ayuda de inmediato si no mejora o si empeora.   Esta informacin no tiene Theme park managercomo fin reemplazar el consejo del mdico. Asegrese de hacerle al mdico cualquier pregunta que tenga.   Document Released: 05/29/2005 Document Revised: 05/13/2012 Elsevier Interactive Patient Education Yahoo[employer] Inc2016 Elsevier Inc.

## 2016-05-02 NOTE — Progress Notes (Addendum)
[facility] [address], [place] [address] [phone]  By signing my name below I, Shannon Diaz, attest that this documentation has been prepared under the direction and in the presence of Shannon PlattStephanie Jeson Camacho PA. Electonically Signed. Shannon Diaz, Scribe 05/02/2016 at 3:36 PM  Date:  05/01/2016   Name:  Shannon GoltzSugeyli Bonilla Diaz   DOB:  1990-11-09   MRN:  [id number]  PCP:  No PCP Per Patient  Chief Complaint  Patient presents with  . Other    rash on back      History of Present Illness:  Shannon Diaz is a 25 y.o. female patient who [facility] with a rash noticed yesterday. Pt works for a [employer], [employer],  in the Arts administratorcleaning department. After cleaning a room yesterday she had itchiness to left lower back; she went to clean the same room today, and after doing so, she her entire body felt itchy. She notes bumps to neck and shoulder. She is unsure of bed bugs. States animals are allowed to room at hotel though she is unsure if these were there. Room did not smell like animals yesterday.    No new hygeine products.  There are no active problems to display for this patient.   Medication list has been reviewed and updated.  No current outpatient prescriptions on file prior to visit.   No current facility-administered medications on file prior to visit.     Review of Systems  Constitutional: Negative for chills and fever.  Skin: Positive for itching and rash.   ROS unremarkable unless otherwise specified.  Physical Examination: BP 130/80 (BP Location: Right Arm, Patient Position: Sitting, Cuff Size: Large)   Pulse 61   Temp 98.8 F (37.1 C) (Oral)   Resp 17   Ht 5\' 1"  (1.549 m)   Wt 193 lb (87.5 kg)   SpO2 99%   BMI 36.47 kg/m  Ideal Body Weight: @FLOWAMB ([id number])@[phone])@  Physical Exam  Constitutional: She is oriented to person, place, and time. She appears well-developed and well-nourished. No distress.  HENT:  Head: Normocephalic  and atraumatic.  Right Ear: External ear normal.  Left Ear: External ear normal.  Eyes: Conjunctivae and EOM are normal. Pupils are equal, round, and reactive to light.  Cardiovascular: Normal rate.   Pulmonary/Chest: Effort normal. No respiratory distress.  Neurological: She is alert and oriented to person, place, and time.  Skin: She is not diaphoretic.  Right lower back- Raised erythematous patch the was non tender.   Psychiatric: She has a normal mood and affect. Her behavior is normal.     Assessment and Plan: Shannon GoltzSugeyli Bonilla Diaz is a 25 y.o. female who is here today for cc of rash. Difficult to determine rash.  Does not have tracking or central darkened center like scabies, or bed bugs.   Given abx/steroid combo.  rtc in 4 days.  Continue work without restriction.  Rash and nonspecific skin eruption - Plan: neomycin-polymyxin-hydrocortisone (CORTISPORIN) 3.5-10000-0.5 cream  Shannon PlattStephanie Reilyn Nelson, PA-C [facility] 05/02/2016 3:36 PM

## 2021-03-20 ENCOUNTER — Ambulatory Visit (HOSPITAL_COMMUNITY)
Admission: EM | Admit: 2021-03-20 | Discharge: 2021-03-20 | Disposition: A | Discharge status: Home / Self Care | Payer: Self-pay | Attending: Student | Admitting: Student

## 2021-03-20 ENCOUNTER — Encounter (HOSPITAL_COMMUNITY): Payer: Self-pay

## 2021-03-20 ENCOUNTER — Other Ambulatory Visit: Payer: Self-pay

## 2021-03-20 ENCOUNTER — Ambulatory Visit: Payer: Self-pay

## 2021-03-20 ENCOUNTER — Ambulatory Visit (INDEPENDENT_AMBULATORY_CARE_PROVIDER_SITE_OTHER): Payer: Self-pay

## 2021-03-20 DIAGNOSIS — L03116 Cellulitis of left lower limb: Secondary | ICD-10-CM

## 2021-03-20 DIAGNOSIS — M25572 Pain in left ankle and joints of left foot: Secondary | ICD-10-CM

## 2021-03-20 MED ORDER — DOXYCYCLINE HYCLATE 100 MG PO CAPS: 100.0000 mg | ORAL_CAPSULE | Freq: Two times a day (BID) | ORAL | 0 refills | Status: AC

## 2021-03-20 NOTE — ED Provider Notes (Signed)
[facility]    CSN: [id number] Arrival date & time: 03/20/21  1201      History   Chief Complaint Chief Complaint  Patient presents with   abscess to foot    HPI Shannon Diaz is a 30 y.o. female presenting with painful scab to left posterior calf, this started as a pimple but now turned into the large scab that it is today.  Some bleeding though none currently.  Tenderness.  Denies pus.  Feeling well otherwise, denies fever/chills. States multiple times this did not start out as a mole or skin lesion.   HPI  Past Medical History:  Diagnosis Date   Late prenatal care    22 weeks   No pertinent past medical history    Sickle cell trait (HCC)    HgbS trait positive   Trichomonas vaginalis (TV) infection    During pregnancy    There are no problems to display for this patient.   Past Surgical History:  Procedure Laterality Date   CESAREAN SECTION  09/05/2012   Procedure: CESAREAN SECTION;  Surgeon: Allie Bossier, MD;  Location: [facility];  Service: Obstetrics;  Laterality: N/A;  Primary cesarean section of baby boy at 2342 APGAR 7/8   NO PAST SURGERIES      OB History     Gravida  2   Para  2   Term  1   Preterm  1   AB  0   Living  2      SAB  0   IAB  0   Ectopic  0   Multiple  0   Live Births  2            Home Medications    Prior to Admission medications   Medication Sig Start Date End Date Taking? Authorizing Provider  neomycin-polymyxin-hydrocortisone (CORTISPORIN) 3.5-10000-0.5 cream Apply topically 2 (two) times daily. 05/01/16   Garnetta Buddy, PA    Family History Family History  Problem Relation Age of Onset   Diabetes Other     Social History Social History   Tobacco Use   Smoking status: Never   Smokeless tobacco: Never  Substance Use Topics   Alcohol use: No   Drug use: No     Allergies   Penicillins   Review of Systems Review of Systems  Skin:  Positive for color change.  All  other systems reviewed and are negative.   Physical Exam Triage Vital Signs ED Triage Vitals  Enc Vitals Group     BP 03/20/21 1313 132/85     Pulse Rate 03/20/21 1313 89     Resp 03/20/21 1313 18     Temp 03/20/21 1313 98.7 F (37.1 C)     Temp Source 03/20/21 1313 Oral     SpO2 03/20/21 1313 99 %     Weight --      Height --      Head Circumference --      Peak Flow --      Pain Score 03/20/21 1310 2     Pain Loc --      Pain Edu? --      Excl. in GC? --    No data found.  Updated Vital Signs BP 132/85 (BP Location: Right Arm)   Pulse 89   Temp 98.7 F (37.1 C) (Oral)   Resp 18   LMP 02/18/2021   SpO2 99%   Visual Acuity Right Eye Distance:  Left Eye Distance:   Bilateral Distance:    Right Eye Near:   Left Eye Near:    Bilateral Near:     Physical Exam Vitals reviewed.  Constitutional:      Appearance: Normal appearance.  HENT:     Head: Normocephalic and atraumatic.  Pulmonary:     Effort: Pulmonary effort is normal.  Skin:    Comments: See image below 1.5cm round raised scab with surrounding tenderness and erythema.  Neurological:     General: No focal deficit present.     Mental Status: She is alert and oriented to person, place, and time.  Psychiatric:        Mood and Affect: Mood normal.        Behavior: Behavior normal.        Thought Content: Thought content normal.        Judgment: Judgment normal.         UC Treatments / Results  Labs (all labs ordered are listed, but only abnormal results are displayed) Labs Reviewed - No data to display  EKG   Radiology No results found.  Procedures Procedures (including critical care time)  Medications Ordered in UC Medications - No data to display  Initial Impression / Assessment and Plan / UC Course  I have reviewed the triage vital signs and the nursing notes.  Pertinent labs & imaging results that were available during my care of the patient were reviewed by me and considered  in my medical decision making (see chart for details).     This patient is a very pleasant 30 y.o. year old female presenting with skin lesion, x1 month getting worse. Afebrile, nontachy.states she is not pregnant or breastfeeding.  Xray L ankle - no acute abnormality. Doxycycline. F/u with surgery at their earliest convenience, information provided.  Tdap UTD 2014.  Strict ED return precautions discussed. Patient verbalizes understanding and agreement.    Final Clinical Impressions(s) / UC Diagnoses   Final diagnoses:  None     Discharge Instructions      -Doxycycline twice daily for 7 days.  Make sure to wear sunscreen while spending time outside while on this medication as it can increase your chance of sunburn. You can take this medication with food if you have a sensitive stomach. -Wash your wound with gentle soap and water 1-2 times daily.  Let air dry or gently pat. -Call [facility] today to schedule follow-up. You probably need the scab to be removed.      ED Prescriptions   None    PDMP not reviewed this encounter.   Rhys Martini, PA-C 03/20/21 1441

## 2021-03-20 NOTE — ED Triage Notes (Signed)
Pt c/o pimple on left foot, for a month, it is now bleeding (a week ago).   Interventions: cleansing site and applied abx neosporin)

## 2021-03-20 NOTE — Discharge Instructions (Addendum)
-  Doxycycline twice daily for 7 days.  Make sure to wear sunscreen while spending time outside while on this medication as it can increase your chance of sunburn. You can take this medication with food if you have a sensitive stomach. -Wash your wound with gentle soap and water 1-2 times daily.  Let air dry or gently pat. -Call [facility] today to schedule follow-up. You probably need the scab to be removed.

## 2021-04-22 ENCOUNTER — Other Ambulatory Visit: Payer: Self-pay

## 2021-04-22 ENCOUNTER — Encounter (HOSPITAL_COMMUNITY): Payer: Self-pay | Admitting: *Deleted

## 2021-04-22 ENCOUNTER — Emergency Department (HOSPITAL_COMMUNITY)
Admission: EM | Admit: 2021-04-22 | Discharge: 2021-04-22 | Disposition: A | Discharge status: Home / Self Care | Payer: Self-pay | Attending: Emergency Medicine | Admitting: Emergency Medicine

## 2021-04-22 DIAGNOSIS — L989 Disorder of the skin and subcutaneous tissue, unspecified: Secondary | ICD-10-CM | POA: Insufficient documentation

## 2021-04-22 NOTE — ED Triage Notes (Signed)
Pt was tx for infection on L lower leg about 1 month ago.  Symptoms improved x 1 month.  Now, pt states the area is bleeding.  Bleeding is controlled - dressing applied.

## 2021-04-22 NOTE — ED Provider Notes (Signed)
[facility] EMERGENCY DEPARTMENT Provider Note   CSN: [id number] Arrival date & time: 04/22/21  2042     History Chief Complaint  Patient presents with   Leg Pain    Shannon Diaz is a 30 y.o. female.  30 year old female with prior medical history as detailed below presents for evaluation.  Patient reports lesion to the left lateral lower leg.  This is been present for at least 1 month.  She reports that she was seen last month in urgent care for same complaint.  She was told that she had an infected area to the leg.  She was given antibiotics.  The the lesion did not improve.  She reports recurrent bleeding with most recent episode of bleeding from this area tonight.  There is no active bleeding noted on exam tonight.    She has not yet seen dermatology regarding this lesion.  The history is provided by the patient.  Illness Location:  Bleeding from skin lesion Severity:  Mild Onset quality:  Gradual Duration:  2 months Timing:  Constant Progression:  Worsening Chronicity:  New     Past Medical History:  Diagnosis Date   Late prenatal care    22 weeks   No pertinent past medical history    Sickle cell trait (HCC)    HgbS trait positive   Trichomonas vaginalis (TV) infection    During pregnancy    There are no problems to display for this patient.   Past Surgical History:  Procedure Laterality Date   CESAREAN SECTION  09/05/2012   Procedure: CESAREAN SECTION;  Surgeon: Allie Bossier, MD;  Location: [facility];  Service: Obstetrics;  Laterality: N/A;  Primary cesarean section of baby boy at 2342 APGAR 7/8   NO PAST SURGERIES       OB History     Gravida  2   Para  2   Term  1   Preterm  1   AB  0   Living  2      SAB  0   IAB  0   Ectopic  0   Multiple  0   Live Births  2           Family History  Problem Relation Age of Onset   Diabetes Other     Social History   Tobacco Use   Smoking status: Never    Smokeless tobacco: Never  Vaping Use   Vaping Use: Some days  Substance Use Topics   Alcohol use: No   Drug use: No    Home Medications Prior to Admission medications   Medication Sig Start Date End Date Taking? Authorizing Provider  neomycin-polymyxin-hydrocortisone (CORTISPORIN) 3.5-10000-0.5 cream Apply topically 2 (two) times daily. 05/01/16   Trena Platt D, PA    Allergies    Penicillins  Review of Systems   Review of Systems  All other systems reviewed and are negative.  Physical Exam Updated Vital Signs BP 140/90   Pulse (!) 107   Temp 98.7 F (37.1 C) (Oral)   Resp 16   LMP 03/19/2021   SpO2 99%   Physical Exam Vitals and nursing note reviewed.  Constitutional:      General: She is not in acute distress.    Appearance: Normal appearance. She is well-developed.  HENT:     Head: Normocephalic and atraumatic.  Eyes:     Conjunctiva/sclera: Conjunctivae normal.     Pupils: Pupils are equal, round, and reactive to  light.  Cardiovascular:     Rate and Rhythm: Normal rate and regular rhythm.     Heart sounds: Normal heart sounds.  Pulmonary:     Effort: Pulmonary effort is normal. No respiratory distress.     Breath sounds: Normal breath sounds.  Abdominal:     General: There is no distension.     Palpations: Abdomen is soft.     Tenderness: There is no abdominal tenderness.  Musculoskeletal:        General: No deformity. Normal range of motion.     Cervical back: Normal range of motion and neck supple.  Skin:    General: Skin is warm and dry.     Comments: Lesion to left lateral lower leg.  Highly vascularized tissue present.  No signs of infection.  See attached photos.  No active bleeding noted.  Lesion is concerning for possible skin malignancy.  Neurological:     General: No focal deficit present.     Mental Status: She is alert and oriented to person, place, and time.       ED Results / Procedures / Treatments   Labs (all labs ordered  are listed, but only abnormal results are displayed) Labs Reviewed - No data to display  EKG None  Radiology No results found.  Procedures Procedures   Medications Ordered in ED Medications - No data to display  ED Course  I have reviewed the triage vital signs and the nursing notes.  Pertinent labs & imaging results that were available during my care of the patient were reviewed by me and considered in my medical decision making (see chart for details).    MDM Rules/Calculators/A&P                           MDM  MSE complete  Mykael Trott was evaluated in Emergency Department on 04/22/2021 for the symptoms described in the history of present illness. She was evaluated in the context of the global COVID-19 pandemic, which necessitated consideration that the patient might be at risk for infection with the SARS-CoV-2 virus that causes COVID-19. Institutional protocols and algorithms that pertain to the evaluation of patients at risk for COVID-19 are in a state of rapid change based on information released by regulatory bodies including the CDC and federal and state organizations. These policies and algorithms were followed during the patient's care in the ED.  Patient is presenting with complaint of bleeding from lesion to the left lower leg.  Patient's skin lesion is concerning for possible undiagnosed skin malignancy.  Patient without active bleeding upon evaluation.  Dressing applied without difficulty.  Patient is repeatedly made aware of the fact that dermatology needs to evaluate and biopsy this lesion.  She will require likely excision of same.  Ambulatory referrals made for both dermatology and plastic surgery.  Patient given additional dermatology follow-up recommendations.  Patient does understand that early diagnosis of skin malignancy is easily treatable.  Importance of close follow-up is stressed repeatedly.  Strict return precautions given and  understood.  Final Clinical Impression(s) / ED Diagnoses Final diagnoses:  Skin lesion    Rx / DC Orders ED Discharge Orders          Ordered    Ambulatory referral to Dermatology       Comments: Lesion to lateral left lower leg. Concern for possible skin malignancy.   04/22/21 2141    Ambulatory referral to Plastic Surgery  Comments: Lesion to lateral left lower leg. Concern for possible skin malignancy.   04/22/21 2141             Wynetta Fines, MD 04/22/21 2149

## 2021-04-22 NOTE — Discharge Instructions (Addendum)
The lesion on your leg is very concerning for possible skin malignancy (or a skin cancer). This requires further work-up and evaluation by a Dermatologist.  This should not be ignored.  Early diagnosis of a skin malignancy is easily treatable.  Keep dressing in place to avoid irritation of the lesion or recurrent bleeding.

## 2021-04-22 NOTE — ED Notes (Signed)
Pt has left lateral lower leg protrusion that is the size of a cotton ball. Pt states it has been growing. It bleeds if hit. RN covered protrusion with non-adhesive bandage, 4x4 bandage, and wrapped with guaze.

## 2021-05-15 ENCOUNTER — Encounter: Payer: Self-pay | Admitting: Adult Health

## 2021-05-15 ENCOUNTER — Ambulatory Visit: Payer: Self-pay | Admitting: Adult Health

## 2021-05-15 ENCOUNTER — Other Ambulatory Visit: Payer: Self-pay

## 2021-05-15 ENCOUNTER — Other Ambulatory Visit (INDEPENDENT_AMBULATORY_CARE_PROVIDER_SITE_OTHER): Payer: Self-pay

## 2021-05-15 VITALS — BP 145/98 | HR 93 | Ht 61.0 in | Wt 233.0 lb

## 2021-05-15 DIAGNOSIS — Z3201 Encounter for pregnancy test, result positive: Secondary | ICD-10-CM | POA: Insufficient documentation

## 2021-05-15 DIAGNOSIS — R030 Elevated blood-pressure reading, without diagnosis of hypertension: Secondary | ICD-10-CM

## 2021-05-15 DIAGNOSIS — Z3A01 Less than 8 weeks gestation of pregnancy: Secondary | ICD-10-CM | POA: Insufficient documentation

## 2021-05-15 DIAGNOSIS — N926 Irregular menstruation, unspecified: Secondary | ICD-10-CM | POA: Insufficient documentation

## 2021-05-15 DIAGNOSIS — Z975 Presence of (intrauterine) contraceptive device: Secondary | ICD-10-CM | POA: Insufficient documentation

## 2021-05-15 DIAGNOSIS — O3680X0 Pregnancy with inconclusive fetal viability, not applicable or unspecified: Secondary | ICD-10-CM | POA: Insufficient documentation

## 2021-05-15 LAB — POCT URINE PREGNANCY: Preg Test, Ur: POSITIVE — AB

## 2021-05-15 MED ORDER — PRENATAL PLUS 27-1 MG PO TABS: 1.0000 | ORAL_TABLET | Freq: Every day | ORAL | 12 refills | Status: AC

## 2021-05-15 NOTE — Progress Notes (Unsigned)
   NURSE VISIT- PREGNANCY CONFIRMATION   SUBJECTIVE:  Shannon Diaz is a 30 y.o. [phone] female at [redacted]w[redacted]d by uncertain LMP of Patient's last menstrual period was 03/26/2021 (approximate). Here for pregnancy confirmation.  Home pregnancy test: positive x 4   She reports nausea.  She is not taking prenatal vitamins.    OBJECTIVE:  BP (!) 145/98 (BP Location: Right Arm, Patient Position: Sitting, Cuff Size: Normal)   Pulse 93   Ht 5\' 1"  (1.549 m)   Wt 233 lb (105.7 kg)   LMP 03/26/2021 (Approximate)   BMI 44.02 kg/m   Appears well, in no apparent distress  Results for orders placed or performed in visit on 05/15/21 (from the past 24 hour(s))  POCT urine pregnancy   Collection Time: 05/15/21 11:27 AM  Result Value Ref Range   Preg Test, Ur Positive (A) Negative    ASSESSMENT: Positive pregnancy test, [redacted]w[redacted]d by LMP    PLAN: Schedule for dating ultrasound in 1-2 weeks Prenatal vitamins: plans to begin OTC ASAP   Nausea medicines: not currently needed   OB packet given: Yes  Adrianne Shackleton A Allyse Fregeau  05/15/2021 11:28 AM

## 2021-05-15 NOTE — Progress Notes (Signed)
  Subjective:     Patient ID: Shannon Diaz, female   DOB: 07/02/1991, 30 y.o.   MRN: [id number]  HPI Shannon Diaz is a 30 year old Hispanic female, has SO, in for UPT has missed a period and had 4+HPTs.She has expired nexplanon left arm, has had 6-7 years now. She was nurse visit, worked in as is new pt.    Review of Systems +missed period,with 4+HPTs Reviewed past medical,surgical, social and family history. Reviewed medications and allergies.      Objective:   Physical Exam BP (!) 145/98 (BP Location: Right Arm, Patient Position: Sitting, Cuff Size: Normal)   Pulse 93   Ht 5\' 1"  (1.549 m)   Wt 233 lb (105.7 kg)   LMP 03/26/2021 (Approximate)   BMI 44.02 kg/m  UPT is +, about 7+1 week by LMP with EDD 12/31/21.  Skin warm and dry. Neck: mid line trachea, normal thyroid, good ROM, no lymphadenopathy noted. Lungs: clear to ausculation bilaterally. Cardiovascular: regular rate and rhythm. Abdomen is soft.Nexplanon palpated left arm.     Upstream - 05/15/21 1200       Pregnancy Intention Screening   Does the patient want to become pregnant in the next year? N/A    Does the patient's partner want to become pregnant in the next year? N/A    Would the patient like to discuss contraceptive options today? N/A      Contraception Wrap Up   Current Method Pregnant/Seeking Pregnancy    End Method Pregnant/Seeking Pregnancy    Contraception Counseling Provided No             Assessment:     1. Missed periods +UPT  2. Positive pregnancy test   3. Less than [redacted] weeks gestation of pregnancy Will rx PNV Meds ordered this encounter  Medications   prenatal vitamin w/FE, FA (PRENATAL 1 + 1) 27-1 MG TABS tablet    Sig: Take 1 tablet by mouth daily at 12 noon.    Dispense:  30 tablet    Refill:  12    Order Specific Question:   Supervising Provider    Answer:   05/17/21, LUTHER H [[id number]]     4. Encounter to determine fetal viability of pregnancy, single or unspecified fetus Dating  [name] Hidden 05/28/21  5. Elevated BP without diagnosis of hypertension Will watch for now and recheck in 2 weeks   6. Nexplanon in place Return 9/28 for removal    Plan:     Review handout by [facility]

## 2021-05-28 ENCOUNTER — Other Ambulatory Visit: Payer: Self-pay

## 2021-05-28 ENCOUNTER — Other Ambulatory Visit: Payer: Self-pay | Admitting: Adult Health

## 2021-05-28 ENCOUNTER — Ambulatory Visit (INDEPENDENT_AMBULATORY_CARE_PROVIDER_SITE_OTHER): Payer: Self-pay

## 2021-05-28 DIAGNOSIS — O3680X0 Pregnancy with inconclusive fetal viability, not applicable or unspecified: Secondary | ICD-10-CM

## 2021-05-28 DIAGNOSIS — Z3A09 9 weeks gestation of pregnancy: Secondary | ICD-10-CM

## 2021-05-28 NOTE — Progress Notes (Signed)
[place] 14+4 wks,single IUP,FHR 152 bpm,posterior placenta gr 0,normal ovaries

## 2021-05-30 ENCOUNTER — Other Ambulatory Visit: Payer: Self-pay

## 2021-05-30 ENCOUNTER — Ambulatory Visit (INDEPENDENT_AMBULATORY_CARE_PROVIDER_SITE_OTHER): Payer: Self-pay | Admitting: Adult Health

## 2021-05-30 ENCOUNTER — Encounter: Payer: Self-pay | Admitting: Adult Health

## 2021-05-30 VITALS — BP 123/84 | HR 101 | Ht 61.0 in | Wt 232.0 lb

## 2021-05-30 DIAGNOSIS — Z3046 Encounter for surveillance of implantable subdermal contraceptive: Secondary | ICD-10-CM | POA: Insufficient documentation

## 2021-05-30 NOTE — Patient Instructions (Signed)
Keep clean and dry x 24 hours, no heavy lifting, keep steri strips on x 72 hours, Keep pressure dressing on x 24 hours. Follow up prn problems.

## 2021-05-30 NOTE — Progress Notes (Signed)
  Subjective:     Patient ID: Shannon Diaz, female   DOB: 31-May-1991, 30 y.o.   MRN: [id number]  HPI Shannon Diaz is a 30 year old Hispanic female, single with SO, [id number], in for nexplanon removal, it has expired and she is pregnant.   Review of Systems For nexplanon removal Reviewed past medical,surgical, social and family history. Reviewed medications and allergies.     Objective:   Physical Exam BP 123/84 (BP Location: Left Arm, Patient Position: Sitting, Cuff Size: Normal)   Pulse (!) 101   Ht 5\' 1"  (1.549 m)   Wt 232 lb (105.2 kg)   LMP 03/26/2021 (Approximate)   BMI 43.84 kg/m  Consent signed and time out called. Left arm cleansed with betadine, and injected with 1.5 cc 2% lidocaine and waited til numb.Under sterile technique a #11 blade was used to make small vertical incision, and a curved forceps was used to easily remove rod. Steri strips applied. Pressure dressing applied.    Fall risk is low  Upstream - 05/30/21 1113       Pregnancy Intention Screening   Does the patient want to become pregnant in the next year? N/A    Does the patient's partner want to become pregnant in the next year? N/A    Would the patient like to discuss contraceptive options today? N/A      Contraception Wrap Up   Current Method Pregnant/Seeking Pregnancy    End Method Pregnant/Seeking Pregnancy    Contraception Counseling Provided No             Assessment:     1. Encounter for Nexplanon removal   - keep clean and dry x 24 hours, no heavy lifting, keep steri strips on x 72 hours, Keep pressure dressing on x 24 hours. Follow up prn problems.     Plan:      Return 06/28/21 has new OB appt scheduled then.

## 2021-06-15 DIAGNOSIS — L989 Disorder of the skin and subcutaneous tissue, unspecified: Secondary | ICD-10-CM | POA: Diagnosis not present

## 2021-06-15 DIAGNOSIS — O99711 Diseases of the skin and subcutaneous tissue complicating pregnancy, first trimester: Secondary | ICD-10-CM | POA: Diagnosis present

## 2021-06-16 ENCOUNTER — Encounter (HOSPITAL_COMMUNITY): Payer: Self-pay | Admitting: *Deleted

## 2021-06-16 ENCOUNTER — Emergency Department (HOSPITAL_COMMUNITY): Payer: Medicaid Other

## 2021-06-16 ENCOUNTER — Emergency Department (HOSPITAL_COMMUNITY)
Admission: EM | Admit: 2021-06-16 | Discharge: 2021-06-16 | Disposition: A | Discharge status: Home / Self Care | Payer: Medicaid Other | Attending: Emergency Medicine | Admitting: Emergency Medicine

## 2021-06-16 ENCOUNTER — Other Ambulatory Visit: Payer: Self-pay

## 2021-06-16 DIAGNOSIS — L989 Disorder of the skin and subcutaneous tissue, unspecified: Secondary | ICD-10-CM

## 2021-06-16 NOTE — ED Provider Notes (Signed)
Emergency Medicine Provider Triage Evaluation Note  Shannon Diaz , a 30 y.o. female  was evaluated in triage.  Pt complains of wound to lateral left ankle.  Been there for 2 to 3 months, started bleeding last night and has not stopped bleeding.  Denies any systemic symptoms.  No history of bleeding disorders, not on any blood thinners..  Review of Systems  Positive: Left ankle wound Negative: Fever, chills  Physical Exam  BP 129/79   Pulse 89   Temp 98.1 F (36.7 C) (Oral)   Resp 14   Ht 5\' 1"  (1.549 m)   Wt 105.2 kg   LMP 03/26/2021 (Approximate)   SpO2 99%   BMI 43.82 kg/m  Gen:   Awake, no distress   Resp:  Normal effort  MSK:   Moves extremities without difficulty  Other:  Good cap refill, DP and PT 2+.  No tenderness.  Patient has wound to left ankle.  Medical Decision Making  Medically screening exam initiated at 8:51 AM.  Appropriate orders placed.  Shannon Diaz was informed that the remainder of the evaluation will be completed by another provider, this initial triage assessment does not replace that evaluation, and the importance of remaining in the ED until their evaluation is complete.  Radiograph ordered to assess for any signs of underlying osteomyelitis.    Farley Ly, PA-C 06/16/21 06/18/21    [id number], MD 06/16/21 1054

## 2021-06-16 NOTE — ED Provider Notes (Signed)
[facility] EMERGENCY DEPARTMENT Provider Note   CSN: [id number] Arrival date & time: 06/15/21  2335     History Chief Complaint  Patient presents with   blister on leg    Shannon Diaz is a 30 y.o. female.  HPI 30 yo female g3p2 with recent positive pregnancy test 9/13 presents today complaining of 2-3 months of bleeding wound lle.  She reports that it bleeds intermittently and she has to keep a dressing on it.  She has noted that there is an area that has been getting larger.  She has no known injury to this area.  She denies any history of medical problems, specifically no history of diabetes, injury, or skin infections or melanoma.  She reports that she has made an appoint with a dermatologist but cannot be seen until February.    Past Medical History:  Diagnosis Date   Late prenatal care    22 weeks   No pertinent past medical history    Sickle cell trait (HCC)    HgbS trait positive   Trichomonas vaginalis (TV) infection    During pregnancy    Patient Active Problem List   Diagnosis Date Noted   Encounter for Nexplanon removal 05/30/2021   Encounter to determine fetal viability of pregnancy 05/15/2021   Less than [redacted] weeks gestation of pregnancy 05/15/2021   Positive pregnancy test 05/15/2021   Missed periods 05/15/2021   Elevated BP without diagnosis of hypertension 05/15/2021    Past Surgical History:  Procedure Laterality Date   CESAREAN SECTION  09/05/2012   Procedure: CESAREAN SECTION;  Surgeon: Allie Bossier, MD;  Location: [facility];  Service: Obstetrics;  Laterality: N/A;  Primary cesarean section of baby boy at 2342 APGAR 7/8   NO PAST SURGERIES       OB History     Gravida  3   Para  2   Term  1   Preterm  1   AB  0   Living  2      SAB  0   IAB  0   Ectopic  0   Multiple  0   Live Births  2           Family History  Problem Relation Age of Onset   Hypertension Maternal Grandmother    Diabetes  Maternal Grandmother    Diabetes Maternal Grandfather    Hypertension Mother    Diabetes Mother    Diabetes Other     Social History   Tobacco Use   Smoking status: Never   Smokeless tobacco: Never  Vaping Use   Vaping Use: Former  Substance Use Topics   Alcohol use: No   Drug use: No    Home Medications Prior to Admission medications   Medication Sig Start Date End Date Taking? Authorizing Provider  prenatal vitamin w/FE, FA (PRENATAL 1 + 1) 27-1 MG TABS tablet Take 1 tablet by mouth daily at 12 noon. 05/15/21   Adline Potter, NP    Allergies    Penicillins  Review of Systems   Review of Systems  All other systems reviewed and are negative.  Physical Exam Updated Vital Signs BP 129/79   Pulse 89   Temp 98.1 F (36.7 C) (Oral)   Resp 14   Ht 1.549 m (5\' 1" )   Wt 105.2 kg   LMP 03/26/2021 (Approximate)   SpO2 99%   BMI 43.82 kg/m   Physical Exam Vitals and nursing note  reviewed.  Constitutional:      General: She is not in acute distress.    Appearance: Normal appearance. She is well-developed.  HENT:     Head: Normocephalic and atraumatic.     Right Ear: External ear normal.     Left Ear: External ear normal.     Nose: Nose normal.  Eyes:     Conjunctiva/sclera: Conjunctivae normal.     Pupils: Pupils are equal, round, and reactive to light.  Pulmonary:     Effort: Pulmonary effort is normal.  Musculoskeletal:        General: Normal range of motion.     Cervical back: Normal range of motion and neck supple.  Skin:    General: Skin is warm and dry.     Comments: Left lower extremity with 6 x 7 cm fungating mass on the lateral aspect with no surrounding erythema and is not actively bleeding at this time.  Please see picture  Neurological:     Mental Status: She is alert and oriented to person, place, and time.     Motor: No abnormal muscle tone.     Coordination: Coordination normal.  Psychiatric:        Behavior: Behavior normal.         Thought Content: Thought content normal.       ED Results / Procedures / Treatments   Labs (all labs ordered are listed, but only abnormal results are displayed) Labs Reviewed - No data to display  EKG None  Radiology DG Ankle Complete Left  Result Date: 06/16/2021 CLINICAL DATA:  Pt states that a pimple that she had 2 months ago has grown into a mass on the lateral side of left ankle. EXAM: LEFT ANKLE COMPLETE - 3+ VIEW COMPARISON:  Left ankle radiographs 03/20/2021 FINDINGS: There is no evidence of fracture, dislocation, or joint effusion. There is no evidence of arthropathy or other focal bone abnormality. There is a nonspecific superficial soft tissue mass along the lower lateral left leg measuring approximately 2.8 cm. IMPRESSION: 1. Nonspecific superficial soft tissue mass along the lower lateral left leg measuring approximately 2.8 cm. 2. No acute osseous abnormality. Electronically Signed   By: Emmaline Kluver M.D.   On: 06/16/2021 11:27    Procedures Procedures   Medications Ordered in ED Medications - No data to display  ED Course  I have reviewed the triage vital signs and the nursing notes.  Pertinent labs & imaging results that were available during my care of the patient were reviewed by me and considered in my medical decision making (see chart for details).    MDM Rules/Calculators/A&P                           Fungating mass noted left lower extremity without active bleeding differential to include granuloma, infection, or neoplasm.  Plan imaging here. Discussed with Dr. Shawnie Pons, on-call for [facility].  She advises that if she has  message she will facilitate referral to dermatology.    Messaged patient to Dr. Shawnie Pons.  Imaging pending.  Likely discharge to home with plan in place for outpatient dermatology follow-up arrangement. Reviewed imaging and no evidence of acute bony abnormality.  Patient advised regarding the fact that [facility] will assist her  in getting follow-up with dermatology.  She is to keep dressing in place.  She is to call them on Monday if they have not contacted her.  She is aware  that she needs to have close dermatology follow-up.  Final Clinical Impression(s) / ED Diagnoses Final diagnoses:  Skin lesion of left lower extremity    Rx / DC Orders ED Discharge Orders     None        Margarita Grizzle, MD 06/16/21 1207

## 2021-06-16 NOTE — ED Triage Notes (Signed)
The pt has an abscess on her lt lower leg that she has had for 2-3 months today it has been bleeding  lmp lmp June shes pregnant

## 2021-06-16 NOTE — Discharge Instructions (Addendum)
Please call your obstetrician on Monday.  They have been made aware of the situation and will assist you with finding dermatology follow-up.

## 2021-06-22 ENCOUNTER — Ambulatory Visit: Payer: Self-pay | Admitting: Obstetrics & Gynecology

## 2021-06-22 ENCOUNTER — Other Ambulatory Visit (INDEPENDENT_AMBULATORY_CARE_PROVIDER_SITE_OTHER): Payer: Self-pay | Admitting: General Surgery

## 2021-06-22 ENCOUNTER — Encounter (HOSPITAL_COMMUNITY): Payer: Self-pay | Admitting: General Surgery

## 2021-06-22 ENCOUNTER — Encounter (HOSPITAL_COMMUNITY)
Admission: RE | Disposition: A | Discharge status: Home / Self Care | Payer: Self-pay | Source: Ambulatory Visit | Attending: General Surgery

## 2021-06-22 ENCOUNTER — Ambulatory Visit (HOSPITAL_COMMUNITY)
Admission: RE | Admit: 2021-06-22 | Discharge: 2021-06-22 | Disposition: A | Discharge status: Home / Self Care | Payer: Medicaid Other | Source: Ambulatory Visit | Attending: General Surgery | Admitting: General Surgery

## 2021-06-22 ENCOUNTER — Encounter: Payer: Self-pay | Admitting: Obstetrics & Gynecology

## 2021-06-22 ENCOUNTER — Other Ambulatory Visit: Payer: Self-pay

## 2021-06-22 VITALS — BP 120/77 | HR 101 | Wt 234.0 lb

## 2021-06-22 DIAGNOSIS — R2242 Localized swelling, mass and lump, left lower limb: Secondary | ICD-10-CM | POA: Insufficient documentation

## 2021-06-22 DIAGNOSIS — Z87891 Personal history of nicotine dependence: Secondary | ICD-10-CM | POA: Diagnosis not present

## 2021-06-22 DIAGNOSIS — O99712 Diseases of the skin and subcutaneous tissue complicating pregnancy, second trimester: Secondary | ICD-10-CM | POA: Insufficient documentation

## 2021-06-22 DIAGNOSIS — L980 Pyogenic granuloma: Secondary | ICD-10-CM | POA: Insufficient documentation

## 2021-06-22 DIAGNOSIS — Z3A15 15 weeks gestation of pregnancy: Secondary | ICD-10-CM | POA: Insufficient documentation

## 2021-06-22 HISTORY — PX: LIPOMA EXCISION: SHX5283

## 2021-06-22 SURGERY — MINOR EXCISION LIPOMA
Anesthesia: LOCAL | Laterality: Left

## 2021-06-22 MED ORDER — OXYCODONE HCL 5 MG PO TABS: 5.0000 mg | ORAL_TABLET | ORAL | 0 refills | Status: DC | PRN

## 2021-06-22 MED ORDER — LIDOCAINE HCL (PF) 1 % IJ SOLN
INTRAMUSCULAR | Status: DC | PRN
  Administered 2021-06-22: 25 mL

## 2021-06-22 SURGICAL SUPPLY — 16 items
ADH SKN CLS APL DERMABOND .7 (GAUZE/BANDAGES/DRESSINGS)
DERMABOND ADVANCED (GAUZE/BANDAGES/DRESSINGS)
DERMABOND ADVANCED .7 DNX12 (GAUZE/BANDAGES/DRESSINGS) IMPLANT
DRSG TEGADERM 2-3/8X2-3/4 SM (GAUZE/BANDAGES/DRESSINGS) ×2 IMPLANT
ELECT NEEDLE TIP 2.8 STRL (NEEDLE) IMPLANT
ELECT REM PT RETURN 9FT ADLT (ELECTROSURGICAL) ×2
ELECTRODE REM PT RTRN 9FT ADLT (ELECTROSURGICAL) ×1 IMPLANT
NS IRRIG 1000ML POUR BTL (IV SOLUTION) IMPLANT
PENCIL HANDSWITCHING (ELECTRODE) ×2 IMPLANT
SPONGE GAUZE 2X2 8PLY STRL LF (GAUZE/BANDAGES/DRESSINGS) ×2 IMPLANT
SUT ETHILON 3 0 FSL (SUTURE) IMPLANT
SUT MNCRL AB 4-0 PS2 18 (SUTURE) IMPLANT
SUT PROLENE 4 0 PS 2 18 (SUTURE) IMPLANT
SUT VIC AB 3-0 SH 27 (SUTURE)
SUT VIC AB 3-0 SH 27X BRD (SUTURE) IMPLANT
TOWEL OR 17X26 4PK STRL BLUE (TOWEL DISPOSABLE) ×2 IMPLANT

## 2021-06-22 NOTE — Progress Notes (Addendum)
Awake. Dressed self. D/C instructions and wound care discussed. Voiced understanding. Sent home with sterile 4"x4" gauze, saline syringes, 1 pour bottle of saline, ABD pads x4 and clear tape.

## 2021-06-22 NOTE — Op Note (Signed)
[facility] Procedure Note  06/22/21  Pre procedure Diagnosis: Left leg mass (3cm)   Post procedure Diagnosis: Same   Procedure(s) Performed: Wide local excision of left leg mass    Surgeon: Leatrice Jewels. Henreitta Leber, MD   Assistants: No qualified resident was available    Anesthesia: Lidocaine 1% 25cc   Specimens: Left leg mass (Short superior, long lateral)    Estimated Blood Loss: Minimal   Complications: None   Wound Class: Dirty ,draining wound   Procedure Indications: Ms Shannon Diaz is a 30 yo [redacted] weeks pregnant with a long fungating mass on the left lateral leg that has been present since July. She reports it starting as a pimple and getting larger and painful. It had a black eschar on it in July and has bled on and off. She has been to the ED multiple times and has been referred but did not follow up. Discussed with the patient risk of bleeding, infection, finding cancer, needing more surgery, dressing needs.   Findings: Large fungating mass on left lower lateral leg       Procedure: The patient was taken to the procedure room and placed on her right side with the left lateral leg exposed and positioned on a pillow. The left leg was prepared and draped in the usual sterile fashion.   Lidocaine 1% was injected around the lesion and an elliptical excision of skin measuring 67mm margin around the lesion was made. The excision was carried beneath the dermal tissue taking some adipose tissue with the scalpel. A full thickness excision was done. The lesion was marked short suture superior and long suture lateral. The wound was made hemostatic with cautery. A saline dampened gauze was placed. An ABD and ACE were wrapped around the leg.   Final inspection revealed acceptable hemostasis. All counts were correct at the end of the case. The patient tolerated the procedure well. She was instructed on wound care and was sent home with supplies. The pathology was sent stat.    Algis Greenhouse, MD [employer] [address] [place], [place] [address] [phone] (office)

## 2021-06-22 NOTE — Progress Notes (Signed)
Wound Care:  Remove your dressing tomorrow 06/23/2021.  After removing the ACE and pad, gently wet the gauze in the wound with saline.  Replace the gauze after opening it up and dampening it with saline (squeeze the gauze well). Place it in the wound. Replace the ABD and replace the ACE wrap. Change your dressing everyday. Tylenol for pain control and Roxicodone.  Will see you in the clinic on 06/26/2021 for a wound check. Will call with the pathology report.   Cuidado de heridas:  Retire su vendaje maana 22/06/2021. Despus de retirar el ACE y la almohadilla, humedezca suavemente la gasa en la herida con solucin salina. Vuelva a colocar la gasa despus de abrirla y humedecerla con solucin salina (apriete bien la gasa). Colquelo en la herida. Reemplace el ABD y reemplace la envoltura ACE. Cambia tu vendaje [employer]. Tylenol para el control del dolor y Roxicodona. Nos vemos en la clnica el 25/06/2021 para un chequeo de heridas. Llamar con el informe de patologa.  Algis Greenhouse, MD [facility] [address] [place], [place] [address] [phone] (office)

## 2021-06-22 NOTE — Progress Notes (Signed)
[facility]  Urgent referral from Dr. [name] Hidden for excisional biopsy for bleeding mass on left leg in [redacted] week pregnant patient. Will do WLE with local and dressing changes. Will send pathology stat.   Algis Greenhouse, MD [facility] [address] [place], [place] [address] [phone] (office)

## 2021-06-22 NOTE — H&P (Signed)
[facility] History and Physical  Reason for Referral: Left leg mass, bleeding concern malignancy  Referring Physician: Dr. Donzetta Starch Shannon Diaz is a 30 y.o. female.  HPI: Ms. Shannon Diaz is a 30 yo who is [redacted] weeks pregnant and has had a left leg lesion/ mass since June/ July. She has been to the ED multiple times with thsi lesion and reported no prior mole or skin lesion and no bites or infections. She says it started like a pimple and progressively became larger and had a black eschar. Photos in the chart document the progression of the lesion. She has had multiple referral from the ED for dermatology, general surgery, plastic surgery but had not been to see any of these people. She ahs received antibiotics for possible infection related to the lesion.   She was seen again in the ED 06/16/2021 and referred to [facility] for her pregnancy with plans to get her referred to dermatology again. Dr. [name] Hidden saw her today and urgently asked me to see her due to the leg lesion. It is actively bleeding and very large in nature.   We have discussed an urgent excisional biopsy to get a diagnosis pending further treatment needs for this patient.   Past Medical History:  Diagnosis Date   Late prenatal care    22 weeks   No pertinent past medical history    Sickle cell trait (HCC)    HgbS trait positive   Trichomonas vaginalis (TV) infection    During pregnancy    Past Surgical History:  Procedure Laterality Date   CESAREAN SECTION  09/05/2012   Procedure: CESAREAN SECTION;  Surgeon: Allie Bossier, MD;  Location: [facility];  Service: Obstetrics;  Laterality: N/A;  Primary cesarean section of baby boy at 2342 APGAR 7/8   NO PAST SURGERIES      Family History  Problem Relation Age of Onset   Hypertension Maternal Grandmother    Diabetes Maternal Grandmother    Diabetes Maternal Grandfather    Hypertension Mother    Diabetes Mother    Diabetes Other     Social History   Tobacco Use    Smoking status: Never   Smokeless tobacco: Never  Vaping Use   Vaping Use: Former  Substance Use Topics   Alcohol use: No   Drug use: No    Medications: I have reviewed the patient's current medications. Prior to Admission:  Medications Prior to Admission  Medication Sig Dispense Refill Last Dose   prenatal vitamin w/FE, FA (PRENATAL 1 + 1) 27-1 MG TABS tablet Take 1 tablet by mouth daily at 12 noon. 30 tablet 12     Allergies  Allergen Reactions   Penicillins Swelling and Rash    Skin gets red, experiences swelling    ROS:  A comprehensive review of systems was negative except for: Genitourinary: positive for pregnant [redacted] weeks Musculoskeletal: positive for left leg lesion, bleeding lesion  Blood pressure 137/83, pulse (!) 104, temperature 98.9 F (37.2 C), temperature source Oral, resp. rate 16, last menstrual period 03/26/2021, SpO2 98 %. Physical Exam Vitals reviewed.  Constitutional:      Appearance: Normal appearance.  HENT:     Head: Normocephalic.     Nose: Nose normal.  Eyes:     Extraocular Movements: Extraocular movements intact.  Cardiovascular:     Rate and Rhythm: Normal rate.  Pulmonary:     Effort: Pulmonary effort is normal.  Abdominal:     General: There  is no distension.     Palpations: Abdomen is soft.     Tenderness: There is no abdominal tenderness.  Musculoskeletal:        General: Normal range of motion.     Cervical back: Normal range of motion.  Skin:    General: Skin is warm.     Comments: Left lower extremity lateral, fungating mass that is 3cm in size, bleeding, scaling around lesion  Neurological:     General: No focal deficit present.     Mental Status: She is alert and oriented to person, place, and time.  Psychiatric:        Mood and Affect: Mood normal.        Behavior: Behavior normal.        Thought Content: Thought content normal.        Judgment: Judgment normal.   Progression of  Imaging: 03/2021   04/2021    06/16/2021    Today 06/22/2021   Assessment & Plan:  Shannon Diaz is a 30 y.o. female with a large fungating mass on the left leg. Differential could be prior infection or bite with granulation tissue, malignancy, does not appear to be a varicosity that has bleed as it is mass like.    -Discussed excisional biopsy, risk of bleeding, infection, need for open wound and dressing changes, discussed potential for more surgery or procedures, treatment if it is a cancer   All questions were answered to the satisfaction of the patient.    Lucretia Roers 06/22/2021, 11:54 AM

## 2021-06-22 NOTE — Discharge Instructions (Signed)
Wound Care:  Remove your dressing tomorrow 06/23/2021.  After removing the ACE and pad, gently wet the gauze in the wound with saline.  Replace the gauze after opening it up and dampening it with saline (squeeze the gauze well). Place it in the wound. Replace the ABD and replace the ACE wrap. Change your dressing everyday. Tylenol for pain control and Roxicodone.  Will see you in the clinic on 06/26/2021 for a wound check. Will call with the pathology report.   Cuidado de heridas:  Retire su vendaje maana 22/06/2021. Despus de retirar el ACE y la almohadilla, humedezca suavemente la gasa en la herida con solucin salina. Vuelva a colocar la gasa despus de abrirla y humedecerla con solucin salina (apriete bien la gasa). Colquelo en la herida. Reemplace el ABD y reemplace la envoltura ACE. Cambia tu vendaje [employer]. Tylenol para el control del dolor y Roxicodona. Nos vemos en la clnica el 25/06/2021 para un chequeo de heridas. Llamar con el informe de patologa.

## 2021-06-25 ENCOUNTER — Encounter (HOSPITAL_COMMUNITY): Payer: Self-pay | Admitting: General Surgery

## 2021-06-26 ENCOUNTER — Encounter: Payer: Self-pay | Admitting: General Surgery

## 2021-06-26 ENCOUNTER — Ambulatory Visit (INDEPENDENT_AMBULATORY_CARE_PROVIDER_SITE_OTHER): Payer: Self-pay | Admitting: General Surgery

## 2021-06-26 ENCOUNTER — Other Ambulatory Visit: Payer: Self-pay

## 2021-06-26 VITALS — BP 140/76 | HR 119 | Temp 98.1°F | Resp 18 | Ht 61.0 in | Wt 227.2 lb

## 2021-06-26 DIAGNOSIS — R2242 Localized swelling, mass and lump, left lower limb: Secondary | ICD-10-CM

## 2021-06-26 LAB — SURGICAL PATHOLOGY

## 2021-06-26 NOTE — Patient Instructions (Signed)
Will call with pathology results. Continue packing the wound daily (only use the gauze on the wound, not on the skin around the wound).  Cover with pad and ACE wrap.  Activity and weight bearing as tolerated.  Llamar con [employer]. Contine vendando la herida diariamente (solo use la gasa sobre la herida, no sobre la piel alrededor de la herida).  Cubrir con almohadilla y envoltura ACE. Actividad y carga de peso segn tolerancia.

## 2021-06-26 NOTE — Progress Notes (Signed)
[facility]  Wound healing nicely. Doing great with dressing changes. Pathology pending.   BP 140/76   Pulse (!) 119   Temp 98.1 F (36.7 C) (Oral)   Resp 18   Ht 5\' 1"  (1.549 m)   Wt 227 lb 3.2 oz (103.1 kg)   LMP 03/26/2021 (Approximate)   SpO2 98%   BMI 42.93 kg/m    Will call with pathology results. Continue packing the wound daily (only use the gauze on the wound, not on the skin around the wound). Cover with pad and ACE wrap.  Activity and weight bearing as tolerated. Future Appointments  Date Time Provider Department Center  06/28/2021  9:45 AM [facility] - [facility] None  06/28/2021 10:30 AM 06/30/2021, RN [facility]  06/28/2021 10:50 AM 06/30/2021, CNM [facility]  07/03/2021 12:00 PM 13/09/2020, MD [facility] None   Lucretia Roers, MD [facility] [address] [place], [place] [phone] (office)

## 2021-06-27 ENCOUNTER — Other Ambulatory Visit: Payer: Self-pay | Admitting: Obstetrics & Gynecology

## 2021-06-27 ENCOUNTER — Telehealth (INDEPENDENT_AMBULATORY_CARE_PROVIDER_SITE_OTHER): Payer: Self-pay | Admitting: General Surgery

## 2021-06-27 DIAGNOSIS — Z363 Encounter for antenatal screening for malformations: Secondary | ICD-10-CM

## 2021-06-27 DIAGNOSIS — L980 Pyogenic granuloma: Secondary | ICD-10-CM

## 2021-06-27 NOTE — Telephone Encounter (Signed)
[facility]  Notified patient of benign pathology. She was relieved.   FINAL MICROSCOPIC DIAGNOSIS:   A. SKIN, LEG, MASS, LEFT, EXCISION:  -  Pyogenic granuloma, ulcerated  -  See comment   COMMENT:   Dr. Dalia Heading (dermatopathologist) reviewed the case and agrees with  the above diagnosis.   Algis Greenhouse, MD [facility] [address] [place], [place] [address] [phone] (office)

## 2021-06-28 ENCOUNTER — Ambulatory Visit: Payer: Self-pay | Admitting: *Deleted

## 2021-06-28 ENCOUNTER — Encounter: Payer: Self-pay | Admitting: Women's Health

## 2021-06-28 ENCOUNTER — Other Ambulatory Visit: Payer: Self-pay

## 2021-07-03 ENCOUNTER — Ambulatory Visit (INDEPENDENT_AMBULATORY_CARE_PROVIDER_SITE_OTHER): Payer: Self-pay | Admitting: General Surgery

## 2021-07-03 ENCOUNTER — Other Ambulatory Visit: Payer: Self-pay

## 2021-07-03 VITALS — BP 122/78 | HR 84 | Temp 98.4°F | Resp 16 | Ht 61.0 in | Wt 232.0 lb

## 2021-07-03 DIAGNOSIS — L980 Pyogenic granuloma: Secondary | ICD-10-CM

## 2021-07-03 NOTE — Progress Notes (Signed)
[facility]  Wound healing. No major issues. BP 122/78   Pulse 84   Temp 98.4 F (36.9 C) (Other (Comment))   Resp 16   Ht 5\' 1"  (1.549 m)   Wt 232 lb (105.2 kg)   LMP 03/26/2021 (Approximate)   SpO2 97%   BMI 43.84 kg/m      Continue to change the dressing daily but increase to twice daily to keep the drainage less (if it is draining a lot of yellow) and to heal faster.  Contine cambiando el vendaje 03/28/2021, pero aumente a [employer] al da para que el drenaje sea menor (si est drenando mucho amarillo) y para que cicatrice ms rpido.  Future Appointments  Date Time Provider Department Center  07/17/2021 11:15 AM 07/19/2021, MD [facility] None  07/25/2021  2:15 PM [facility] - [facility]YN [facility] None  07/25/2021  3:00 PM 07/27/2021, RN [facility]  07/25/2021  3:30 PM 07/27/2021, CNM [facility]   [facility], MD [facility] [address] [place], [place] [phone] (office)

## 2021-07-03 NOTE — Patient Instructions (Addendum)
Continue to change the dressing daily but increase to twice daily to keep the drainage less (if it is draining a lot of yellow) and to heal faster.  Contine cambiando el vendaje [employer], pero aumente a [employer] al da para que el drenaje sea menor (si est drenando mucho amarillo) y para que cicatrice ms rpido.

## 2021-07-17 ENCOUNTER — Other Ambulatory Visit: Payer: Self-pay

## 2021-07-17 ENCOUNTER — Encounter: Payer: Self-pay | Admitting: General Surgery

## 2021-07-17 ENCOUNTER — Ambulatory Visit (INDEPENDENT_AMBULATORY_CARE_PROVIDER_SITE_OTHER): Payer: Self-pay | Admitting: General Surgery

## 2021-07-17 VITALS — BP 115/78 | HR 85 | Temp 97.9°F | Resp 16 | Ht 61.0 in | Wt 234.0 lb

## 2021-07-17 DIAGNOSIS — L980 Pyogenic granuloma: Secondary | ICD-10-CM

## 2021-07-17 NOTE — Progress Notes (Signed)
[facility]  Wound is looking great.   BP 115/78   Pulse 85   Temp 97.9 F (36.6 C) (Other (Comment))   Resp 16   Ht 5\' 1"  (1.549 m)   Wt 234 lb (106.1 kg)   LMP 03/26/2021 (Approximate)   SpO2 97%   BMI 44.21 kg/m  Left wound healing, granulation tissue   Left lower extremity pyogenic granuloma excision site doing well.   Continue saline dampened gauze, bandage and ace wrap daily. Once the area has shrunk down and is drying out can just start placing a bandaid over the area and some neosporin on it (likely in next 2 weeks).  Contine con gasa humedecida con solucin salina, vendaje y vendaje diario. Una vez que el rea se haya reducido y se est secando, puede comenzar a 03/28/2021 una tirita sobre el rea y un poco de neosporina (probablemente en las prximas 2 semanas). Future Appointments  Date Time Provider Department Center  07/25/2021  2:15 PM [facility] - [facility]YN [facility] None  07/25/2021  3:00 PM 07/27/2021, RN [facility]  07/25/2021  3:30 PM 07/27/2021, CNM [facility]  08/07/2021 10:30 AM 14/01/2021, MD [facility] None   Lucretia Roers, MD [facility] [address] [place], [place] [phone] (office)

## 2021-07-17 NOTE — Patient Instructions (Signed)
Continue saline dampened gauze, bandage and ace wrap daily. Once the area has shrunk down and is drying out can just start placing a bandaid over the area and some neosporin on it (likely in next 2 weeks).  Contine con gasa humedecida con solucin salina, vendaje y vendaje diario. Una vez que el rea se haya reducido y se est secando, puede comenzar a Scientific laboratory technician una tirita sobre el rea y un poco de neosporina (probablemente en las prximas 2 semanas).

## 2021-07-23 DIAGNOSIS — O0990 Supervision of high risk pregnancy, unspecified, unspecified trimester: Secondary | ICD-10-CM | POA: Insufficient documentation

## 2021-07-23 DIAGNOSIS — Z3490 Encounter for supervision of normal pregnancy, unspecified, unspecified trimester: Secondary | ICD-10-CM | POA: Insufficient documentation

## 2021-07-25 ENCOUNTER — Other Ambulatory Visit: Payer: Self-pay

## 2021-07-25 ENCOUNTER — Encounter: Payer: Self-pay | Admitting: Advanced Practice Midwife

## 2021-07-25 ENCOUNTER — Ambulatory Visit (INDEPENDENT_AMBULATORY_CARE_PROVIDER_SITE_OTHER): Payer: Medicaid Other | Admitting: Advanced Practice Midwife

## 2021-07-25 ENCOUNTER — Ambulatory Visit: Payer: Medicaid Other | Admitting: *Deleted

## 2021-07-25 ENCOUNTER — Ambulatory Visit (INDEPENDENT_AMBULATORY_CARE_PROVIDER_SITE_OTHER): Payer: Medicaid Other

## 2021-07-25 ENCOUNTER — Other Ambulatory Visit (HOSPITAL_COMMUNITY)
Admission: RE | Admit: 2021-07-25 | Discharge: 2021-07-25 | Disposition: A | Discharge status: Home / Self Care | Payer: Medicaid Other | Source: Ambulatory Visit | Attending: Advanced Practice Midwife | Admitting: Advanced Practice Midwife

## 2021-07-25 VITALS — BP 119/79 | HR 108 | Wt 231.6 lb

## 2021-07-25 DIAGNOSIS — Z363 Encounter for antenatal screening for malformations: Secondary | ICD-10-CM | POA: Diagnosis not present

## 2021-07-25 DIAGNOSIS — Z880 Allergy status to penicillin: Secondary | ICD-10-CM

## 2021-07-25 DIAGNOSIS — R21 Rash and other nonspecific skin eruption: Secondary | ICD-10-CM

## 2021-07-25 DIAGNOSIS — Z3A22 22 weeks gestation of pregnancy: Secondary | ICD-10-CM | POA: Diagnosis not present

## 2021-07-25 DIAGNOSIS — Z01419 Encounter for gynecological examination (general) (routine) without abnormal findings: Secondary | ICD-10-CM

## 2021-07-25 DIAGNOSIS — O0932 Supervision of pregnancy with insufficient antenatal care, second trimester: Secondary | ICD-10-CM

## 2021-07-25 DIAGNOSIS — Z1379 Encounter for other screening for genetic and chromosomal anomalies: Secondary | ICD-10-CM

## 2021-07-25 DIAGNOSIS — Z98891 History of uterine scar from previous surgery: Secondary | ICD-10-CM

## 2021-07-25 DIAGNOSIS — Z3009 Encounter for other general counseling and advice on contraception: Secondary | ICD-10-CM

## 2021-07-25 DIAGNOSIS — Z3480 Encounter for supervision of other normal pregnancy, unspecified trimester: Secondary | ICD-10-CM

## 2021-07-25 DIAGNOSIS — R030 Elevated blood-pressure reading, without diagnosis of hypertension: Secondary | ICD-10-CM

## 2021-07-25 DIAGNOSIS — O0930 Supervision of pregnancy with insufficient antenatal care, unspecified trimester: Secondary | ICD-10-CM

## 2021-07-25 LAB — POCT URINALYSIS DIPSTICK OB
Blood, UA: NEGATIVE
Glucose, UA: NEGATIVE
Ketones, UA: NEGATIVE
Nitrite, UA: NEGATIVE
POC,PROTEIN,UA: NEGATIVE

## 2021-07-25 MED ORDER — ASPIRIN EC 81 MG PO TBEC: 81.0000 mg | DELAYED_RELEASE_TABLET | Freq: Every day | ORAL | 2 refills | Status: DC

## 2021-07-25 MED ORDER — DIPHENHYDRAMINE HCL 25 MG PO CAPS: 25.0000 mg | ORAL_CAPSULE | Freq: Four times a day (QID) | ORAL | 0 refills | Status: DC | PRN

## 2021-07-25 NOTE — Progress Notes (Signed)
[place] 22+6 wks,cephalic,posterior placenta gr 0,normal ovaries,cx 3.6 cm,SVP of fluid 5.8 cm,FHR 169 bpm,EFW 614 g 79%,anatomy complete,no obvious abnormalities

## 2021-07-25 NOTE — Progress Notes (Signed)
History:   Shannon Diaz is a 30 y.o. [id number] at [redacted]w[redacted]d by LMP c/w 22w 6d [place] being seen today for her first obstetrical visit.  Her obstetrical history is significant for  obesity, late prenatal care, hx cesarean, unwanted fertility . Patient does intend to breast feed. Pregnancy history fully reviewed.  Patient reports  3 week history of "itchy" rash "all over my body".  She has not taken medication or tried other treatments for this complaint. She has a dog but states she dog receives flea treatment. She denies new allergens/irritants in her hygiene regimen.      HISTORY: OB History  Gravida Para Term Preterm AB Living  3 2 1 1  0 2  SAB IAB Ectopic Multiple Live Births  0 0 0 0 2    # Outcome Date GA Lbr Len/2nd Weight Sex Delivery Anes PTL Lv  3 Current           2 Preterm 09/05/12 [redacted]w[redacted]d  6 lb 10 oz (3.005 kg) M CS-LTranv EPI N LIV     Complications: Abruptio Placenta     Name: BONILLA ROMERO,BOY Breanda     Apgar1: 7  Apgar5: 8  1 Term 06/01/11 [redacted]w[redacted]d 10:32 / 03:28 7 lb 11.1 oz (3.49 kg) F Vag-Spont EPI  LIV     Name: BONILLA-ROMERO,GIRL Namiah     Apgar1: [place]: 9    Last pap smear was more than 5 years ago (per patient)  Past Medical History:  Diagnosis Date   Late prenatal care    22 weeks   No pertinent past medical history    Sickle cell trait ([place])    HgbS trait positive   Trichomonas vaginalis (TV) infection    During pregnancy   Past Surgical History:  Procedure Laterality Date   CESAREAN SECTION  09/05/2012   Procedure: CESAREAN SECTION;  Surgeon: Emily Filbert, MD;  Location: [facility];  Service: Obstetrics;  Laterality: N/A;  Primary cesarean section of baby boy at 2342 APGAR 7/8   LIPOMA EXCISION Left 06/22/2021   Procedure: MINOR EXCISION MASS LEG;  Surgeon: Virl Cagey, MD;  Location: [facility];  Service: General;  Laterality: Left;   NO PAST SURGERIES     Family History  Problem Relation Age of Onset   Hypertension Maternal Grandmother     Diabetes Maternal Grandmother    Diabetes Maternal Grandfather    Hypertension Mother    Diabetes Mother    Diabetes Other    Social History   Tobacco Use   Smoking status: Never   Smokeless tobacco: Never  Vaping Use   Vaping Use: Former  Substance Use Topics   Alcohol use: Not Currently   Drug use: No   Allergies  Allergen Reactions   Penicillins Swelling and Rash    Skin gets red, experiences swelling   Current Outpatient Medications on File Prior to Visit  Medication Sig Dispense Refill   prenatal vitamin w/FE, FA (PRENATAL 1 + 1) 27-1 MG TABS tablet Take 1 tablet by mouth daily at 12 noon. 30 tablet 12   No current facility-administered medications on file prior to visit.    Review of Systems Pertinent items noted in HPI and remainder of comprehensive ROS otherwise negative.  Physical Exam:   Vitals:   07/25/21 1513  BP: 119/79  Pulse: (!) 108  Weight: 231 lb 9.6 oz (105.1 kg)    General: well-developed, well-nourished female in no acute distress  Breasts:  normal appearance,  no masses or tenderness bilaterally, exam done in the presence of a chaperone.   Skin: normal coloration and turgor, no rashes  Neurologic: oriented, normal, negative, normal mood  Extremities: normal strength, tone, and muscle mass, ROM of all joints is normal  HEENT PERRLA, extraocular movement intact and sclera clear, anicteric  Neck supple and no masses  Cardiovascular: regular rate and rhythm  Respiratory:  no respiratory distress, normal breath sounds  Abdomen: soft, non-tender; bowel sounds normal; no masses,  no organomegaly  Pelvic: normal external genitalia, no lesions, normal vaginal mucosa, normal vaginal discharge, normal cervix, pap smear done. Exam done in the presence of a chaperone.     Assessment:    Pregnancy: [id number] Patient Active Problem List   Diagnosis Date Noted   History of cesarean delivery 07/25/2021   Encounter for supervision of normal pregnancy,  antepartum 07/23/2021   Lower leg mass, left 06/22/2021   Elevated BP without diagnosis of hypertension 05/15/2021    Indications for ASA therapy (per uptodate)  Two or more of the following: Nulliparity No Obesity (body mass index >30 kg/m2) Yes Family history of preeclampsia in mother or sister No Age ?35 years No Sociodemographic characteristics (African American race, low socioeconomic level) Yes Personal risk factors (eg, previous pregnancy with low birth weight or small for gestational age infant, previous adverse pregnancy outcome [eg, stillbirth], interval >10 years between pregnancies) Yes  Indications for early 1 hour GTT (per uptodate)  BMI >25 (>23 in Asian women) AND one of the following  Gestational diabetes mellitus in a previous pregnancy No Glycated hemoglobin ?5.7 percent (39 mmol/mol), impaired glucose tolerance, or impaired fasting glucose on previous testing ([facility]A1C [facility]) First-degree relative with diabetes No High-risk race/ethnicity (eg, African American, Latino, Native American, Asian American, Pacific Islander) Yes History of cardiovascular disease No Hypertension or on therapy for hypertension: Possible CHTN High-density lipoprotein cholesterol level <35 mg/dL (4.13 mmol/L) and/or a triglyceride level >250 mg/dL (2.44 mmol/L) No Polycystic ovary syndrome No Physical inactivity Yes Other clinical condition associated with insulin resistance (eg, severe obesity, acanthosis nigricans) No Previous birth of an infant weighing ?4000 g No Previous stillbirth of unknown cause No  Plan:    1. Supervision of other normal pregnancy, antepartum - Routine care - CHL AMB BABYSCRIPTS SCHEDULE OPTIMIZATION - Pain Management Screening Profile (10S) - Urine Culture - POC Urinalysis Dipstick OB - CBC/D/Plt+RPR+Rh+ABO+RubIgG... - aspirin EC 81 MG tablet; Take 1 tablet (81 mg total) by mouth daily. Take after 12 weeks for prevention of preeclampsia later in  pregnancy  Dispense: 300 tablet; Refill: 2  2. History of cesarean delivery - Placental abruption with P2 - SVD with P1 - Desires TOLAC - For MD third trimester to discuss  3. Elevated BP without diagnosis of hypertension - 119/79 today - Not on medication, no history of BP management - Comprehensive metabolic panel - Hemoglobin A1c - Protein / creatinine ratio, urine  4. Encounter for gynecological examination with Papanicolaou smear of cervix - Tolerated well - Cytology - PAP  5. Encounter for genetic screening  - AFP, Serum, Open Spina Bifida  6. Penicillin allergy - Reaction = swelling/rash, plan for sensitivities with GBS culture  7. Unwanted fertility - Desires BTL  8. Skin rash - Visibly dry and flaking skin noted across all areas of body including perineum - Small pinpoint round lesions concerning for bug bites - Patient unable to resist scratching - Multiple lesions consistent with excoriation - D/c all hygiene products with fragrance,  allergens - Replace with hypoallergenic formula e.g. Dove Sensitive Skin - Consider managing extremely dry skin with OTC lotion e.g. Eucerin Eczema Relief or Aveeno - Benadryl for itching - Keep this hydrating regimen away from surgical site on left leg - diphenhydrAMINE (BENADRYL ALLERGY) 25 mg capsule; Take 1 capsule (25 mg total) by mouth every 6 (six) hours as needed.  Dispense: 30 capsule; Refill: 0   Initial labs drawn. Continue prenatal vitamins. Problem list reviewed and updated. Genetic Screening discussed, First trimester screen, Quad screen, and NIPS: ordered. Ultrasound discussed; fetal anatomic survey: results reviewed. Anticipatory guidance about prenatal visits given including labs, ultrasounds, and testing. Discussed usage of Babyscripts and virtual visits as additional source of managing and completing prenatal visits in midst of coronavirus and pandemic.   Encouraged to complete MyChart Registration for her  ability to review results, send requests, and have questions addressed.  The nature of [place] for [facility]ulty Practice with multiple MDs and Advanced Practice Providers was explained to patient; also emphasized that residents, students are part of our team. Routine obstetric precautions reviewed. Encouraged to seek out care at office or emergency room [facility] preferred) for urgent and/or emergent concerns. No follow-ups on file.    Mallie Snooks, [place], MSN, CNM Certified Nurse Midwife, Product/process development scientist for [employer], [place]

## 2021-07-26 LAB — CBC/D/PLT+RPR+RH+ABO+RUBIGG...
Antibody Screen: NEGATIVE
Basophils Absolute: 0 10*3/uL (ref 0.0–0.2)
Basos: 0 %
EOS (ABSOLUTE): 0.3 10*3/uL (ref 0.0–0.4)
Eos: 2 %
HCV Ab: <0.1 s/co ratio (ref 0.0–0.9)
HIV Screen 4th Generation wRfx: NONREACTIVE
Hematocrit: 36.4 % (ref 34.0–46.6)
Hemoglobin: 11.7 g/dL (ref 11.1–15.9)
Hepatitis B Surface Ag: NEGATIVE
Immature Grans (Abs): 0.1 10*3/uL (ref 0.0–0.1)
Immature Granulocytes: 1 %
Lymphocytes Absolute: 2.5 10*3/uL (ref 0.7–3.1)
Lymphs: 22 %
MCH: 25.7 pg — ABNORMAL LOW (ref 26.6–33.0)
MCHC: 32.1 g/dL (ref 31.5–35.7)
MCV: 80 fL (ref 79–97)
Monocytes Absolute: 0.8 10*3/uL (ref 0.1–0.9)
Monocytes: 7 %
Neutrophils Absolute: 7.7 10*3/uL — ABNORMAL HIGH (ref 1.4–7.0)
Neutrophils: 68 %
Platelets: 372 10*3/uL (ref 150–450)
RBC: 4.56 x10E6/uL (ref 3.77–5.28)
RDW: 14.7 % (ref 11.7–15.4)
RPR Ser Ql: NONREACTIVE
Rh Factor: POSITIVE
Rubella Antibodies, IGG: 4.57 index (ref >0.99)
WBC: 11.4 10*3/uL — ABNORMAL HIGH (ref 3.4–10.8)

## 2021-07-26 LAB — AFP, SERUM, OPEN SPINA BIFIDA
AFP MoM: 0.83
AFP Value: 53.5 ng/mL
Gest. Age on Collection Date: 22.6 weeks
Maternal Age At EDD: 31 yr
OSBR Risk 1 IN: 10000
Test Results:: NEGATIVE
Weight: 232 [lb_av]

## 2021-07-26 LAB — HEMOGLOBIN A1C
Est. average glucose Bld gHb Est-mCnc: 117 mg/dL
Hgb A1c MFr Bld: 5.7 % — ABNORMAL HIGH (ref 4.8–5.6)

## 2021-07-26 LAB — PROTEIN / CREATININE RATIO, URINE
Creatinine, Urine: 121.8 mg/dL
Protein, Ur: 12.3 mg/dL
Protein/Creat Ratio: 101 mg/g creat (ref 0–200)

## 2021-07-26 LAB — COMPREHENSIVE METABOLIC PANEL
ALT: 18 IU/L (ref 0–32)
AST: 18 IU/L (ref 0–40)
Albumin/Globulin Ratio: 1.1 — ABNORMAL LOW (ref 1.2–2.2)
Albumin: 3.7 g/dL — ABNORMAL LOW (ref 3.9–5.0)
Alkaline Phosphatase: 65 IU/L (ref 44–121)
BUN/Creatinine Ratio: 11 (ref 9–23)
BUN: 5 mg/dL — ABNORMAL LOW (ref 6–20)
Bilirubin Total: <0.2 mg/dL (ref 0.0–1.2)
CO2: 20 mmol/L (ref 20–29)
Calcium: 9.2 mg/dL (ref 8.7–10.2)
Chloride: 103 mmol/L (ref 96–106)
Creatinine, Ser: 0.44 mg/dL — ABNORMAL LOW (ref 0.57–1.00)
Globulin, Total: 3.5 g/dL (ref 1.5–4.5)
Glucose: 71 mg/dL (ref 70–99)
Potassium: 4 mmol/L (ref 3.5–5.2)
Sodium: 136 mmol/L (ref 134–144)
Total Protein: 7.2 g/dL (ref 6.0–8.5)
eGFR: 133 mL/min/{1.73_m2} (ref >59)

## 2021-07-26 LAB — HCV INTERPRETATION

## 2021-07-27 LAB — PMP SCREEN PROFILE (10S), URINE
Amphetamine Scrn, Ur: NEGATIVE ng/mL
BARBITURATE SCREEN URINE: NEGATIVE ng/mL
BENZODIAZEPINE SCREEN, URINE: NEGATIVE ng/mL
CANNABINOIDS UR QL SCN: NEGATIVE ng/mL
Cocaine (Metab) Scrn, Ur: NEGATIVE ng/mL
Creatinine(Crt), U: 137.6 mg/dL (ref 20.0–300.0)
Methadone Screen, Urine: NEGATIVE ng/mL
OXYCODONE+OXYMORPHONE UR QL SCN: NEGATIVE ng/mL
Opiate Scrn, Ur: NEGATIVE ng/mL
Ph of Urine: 5.8 (ref 4.5–8.9)
Phencyclidine Qn, Ur: NEGATIVE ng/mL
Propoxyphene Scrn, Ur: NEGATIVE ng/mL

## 2021-07-28 ENCOUNTER — Encounter: Payer: Self-pay | Admitting: Advanced Practice Midwife

## 2021-07-28 ENCOUNTER — Telehealth: Payer: Self-pay | Admitting: Advanced Practice Midwife

## 2021-07-28 DIAGNOSIS — R7303 Prediabetes: Secondary | ICD-10-CM | POA: Insufficient documentation

## 2021-07-28 LAB — URINE CULTURE

## 2021-07-28 NOTE — Telephone Encounter (Signed)
Patient called at home. Identity confirmed x 2 identifiers. Reviewed Prediabetic result for HgbA1C. Confirmed it is indication for early GTT. Pt verbalizes understanding. Message sent to [facility] to schedule 2 hour GTT ASAP.    Clayton Bibles, MSA, MSN, CNM Certified Nurse Midwife, Biochemist, clinical for [employer], [facility]

## 2021-08-03 ENCOUNTER — Encounter: Payer: Self-pay | Admitting: Advanced Practice Midwife

## 2021-08-03 ENCOUNTER — Other Ambulatory Visit: Payer: Medicaid Other

## 2021-08-03 DIAGNOSIS — B977 Papillomavirus as the cause of diseases classified elsewhere: Secondary | ICD-10-CM | POA: Insufficient documentation

## 2021-08-03 DIAGNOSIS — R7309 Other abnormal glucose: Secondary | ICD-10-CM

## 2021-08-03 LAB — CYTOLOGY - PAP
Chlamydia: NEGATIVE
Comment: NEGATIVE
Comment: NEGATIVE
Comment: NEGATIVE
Comment: NORMAL
Diagnosis: NEGATIVE
HPV 16: NEGATIVE
HPV 18 / 45: NEGATIVE
High risk HPV: POSITIVE — AB
Neisseria Gonorrhea: NEGATIVE

## 2021-08-04 LAB — GLUCOSE TOLERANCE, 2 HOURS W/ 1HR
Glucose, 1 hour: 128 mg/dL (ref 70–179)
Glucose, 2 hour: 89 mg/dL (ref 70–152)
Glucose, Fasting: 95 mg/dL — ABNORMAL HIGH (ref 70–91)

## 2021-08-06 ENCOUNTER — Other Ambulatory Visit: Payer: Self-pay | Admitting: Women's Health

## 2021-08-06 ENCOUNTER — Encounter: Payer: Self-pay | Admitting: Women's Health

## 2021-08-06 DIAGNOSIS — O24419 Gestational diabetes mellitus in pregnancy, unspecified control: Secondary | ICD-10-CM | POA: Insufficient documentation

## 2021-08-07 ENCOUNTER — Ambulatory Visit (INDEPENDENT_AMBULATORY_CARE_PROVIDER_SITE_OTHER): Payer: Medicaid Other | Admitting: General Surgery

## 2021-08-07 ENCOUNTER — Other Ambulatory Visit: Payer: Self-pay | Admitting: *Deleted

## 2021-08-07 ENCOUNTER — Telehealth: Payer: Self-pay | Admitting: *Deleted

## 2021-08-07 ENCOUNTER — Encounter: Payer: Self-pay | Admitting: General Surgery

## 2021-08-07 ENCOUNTER — Other Ambulatory Visit: Payer: Self-pay

## 2021-08-07 VITALS — BP 121/87 | HR 97 | Temp 98.4°F | Resp 16 | Ht 61.0 in | Wt 231.0 lb

## 2021-08-07 DIAGNOSIS — L980 Pyogenic granuloma: Secondary | ICD-10-CM

## 2021-08-07 DIAGNOSIS — O24410 Gestational diabetes mellitus in pregnancy, diet controlled: Secondary | ICD-10-CM

## 2021-08-07 MED ORDER — BLOOD GLUCOSE METER KIT: PACK | 0 refills | Status: DC

## 2021-08-07 NOTE — Patient Instructions (Signed)
Neosporin to the red (granulation tissue) daily and cover with bandaid. Once purple (baby skin) on area, just cover with bandaid.

## 2021-08-07 NOTE — Telephone Encounter (Signed)
No answer @ 9:43 am. JSY 

## 2021-08-07 NOTE — Progress Notes (Signed)
[facility]  Wound healing.  BP 121/87   Pulse 97   Temp 98.4 F (36.9 C) (Other (Comment))   Resp 16   Ht 5\' 1"  (1.549 m)   Wt 231 lb (104.8 kg)   LMP 03/26/2021 (Approximate)   SpO2 97%   BMI 43.65 kg/m  Looking good, granulation and epithelization    Patient s/p excision of pyogenic granuloma. Doing well  Neosporin to the red (granulation tissue) daily and cover with bandaid. Once purple (baby skin) on area, just cover with bandaid.

## 2021-08-08 ENCOUNTER — Other Ambulatory Visit: Payer: Self-pay | Admitting: *Deleted

## 2021-08-08 DIAGNOSIS — O24410 Gestational diabetes mellitus in pregnancy, diet controlled: Secondary | ICD-10-CM

## 2021-08-08 NOTE — Telephone Encounter (Signed)
Pt read MyChart message. Closing encounter. JSY 

## 2021-08-13 ENCOUNTER — Telehealth: Payer: Self-pay | Admitting: Women's Health

## 2021-08-13 ENCOUNTER — Encounter: Payer: Self-pay | Admitting: Women's Health

## 2021-08-13 NOTE — Telephone Encounter (Signed)
Patient speaks spanish and sent a message stating that she is needing test strips for her machine to check her diabetes, patient is also needing the needles for her machine. Pt would like for [place] to send it to her pharmacy.

## 2021-08-13 NOTE — Telephone Encounter (Signed)
Daisy called patient to find out which meter patient has. No answer.

## 2021-08-14 MED ORDER — ACCU-CHEK GUIDE VI STRP: ORAL_STRIP | 12 refills | Status: DC

## 2021-08-14 MED ORDER — ACCU-CHEK SOFTCLIX LANCETS MISC: 12 refills | Status: DC

## 2021-08-14 NOTE — Telephone Encounter (Signed)
Supplies sent in for accu chek meter.

## 2021-08-14 NOTE — Addendum Note (Signed)
Addended by: Annamarie Dawley on: 08/14/2021 08:31 AM   Modules accepted: Orders

## 2021-08-21 ENCOUNTER — Ambulatory Visit: Payer: Medicaid Other | Admitting: Registered"

## 2021-08-21 ENCOUNTER — Encounter: Payer: Medicaid Other | Attending: Women's Health | Admitting: Registered"

## 2021-08-21 ENCOUNTER — Other Ambulatory Visit: Payer: Self-pay

## 2021-08-21 DIAGNOSIS — O24410 Gestational diabetes mellitus in pregnancy, diet controlled: Secondary | ICD-10-CM | POA: Diagnosis not present

## 2021-08-21 DIAGNOSIS — O24419 Gestational diabetes mellitus in pregnancy, unspecified control: Secondary | ICD-10-CM

## 2021-08-21 DIAGNOSIS — Z3A00 Weeks of gestation of pregnancy not specified: Secondary | ICD-10-CM | POA: Diagnosis not present

## 2021-08-21 NOTE — Progress Notes (Signed)
In-person interpreter Raquel from [facility]  Patient was seen for Gestational Diabetes self-management on 08/21/21  Start time 1125 and End time 1210   Estimated due date: 11/22/21; [redacted]w[redacted]d Clinical: Medications: reviewed Medical History: prediabetes Labs: OGTT 95(H)-128-89, A1c 5.7%   Dietary and Lifestyle History: Pt states since she had surgery she has been sleeping a lot and is not able to be on feet very much. Patient consumes very little protein and may be causing slower recovery.  Pt states she has a family history of T2D and she remembers giving her grandfather insulin shots.   Patient states she has limited meat intake x10 yrs when she started developing a rash after eating most meat. Pt states she is able to eat fish, eggs, and dairy. Pt states she has not sought medical care for this condition. Pt denies ever having a tick bite.  Patient states she is still experiencing nausea but has not discussed with MD or taking any medication. Pt reports mostly eating carbohydrates.  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  24 hr Recall: (some choices are due to [facility] Health[facility] foods) First Meal: corn flakes cereal, milk Snack: Second meal: frozen dinner, mac & cheese OR chinese rice, shrimp Snack: fruit Third meal: rice pudding Snack: fruit Beverages: milk, water, wic juice  NUTRITION INTERVENTION  Nutrition education (E-1) on the following topics:   Initial Follow-up  _0  _1  Definition of Gestational Diabetes _2  _3  Why dietary management is important in controlling blood glucose _4  _5  Effects each nutrient has on blood glucose levels _6  _7  Simple carbohydrates vs complex carbohydrates _8  _9  Fluid intake _10  _11  Creating a balanced meal plan _12  _13  Carbohydrate counting  _14  _15  When to check blood glucose levels _16  _17  Proper blood glucose  monitoring techniques _18  _19  Effect of stress and stress reduction techniques  _20  _21  Exercise effect on blood glucose levels, appropriate exercise during pregnancy _22  _23  Importance of limiting caffeine and abstaining from alcohol and smoking _24  _25  Medications used for blood sugar control during pregnancy _26  _27  Hypoglycemia and rule of 15 _28  _29  Postpartum self care  Patient already has a meter, is testing pre breakfast and 2 hours after each meal. Patient does not keep a written log. Patient reports: FBS: most numbers are above 95 and average ~110 mg/dL Postprandial: 120-123 mg/dL  Patient instructed to monitor glucose levels: FBS: 60 - ? 95 mg/dL (some clinics use 90 for cutoff) 1 hour: ? 140 mg/dL 2 hour: ? 120 mg/dL  Patient received handouts: Nutrition Diabetes and Pregnancy Carbohydrate Counting List Ensure coupon  Patient will likely need medication to control. Patient will be seen for follow-up in 2 weeks or as needed.

## 2021-08-22 ENCOUNTER — Encounter: Payer: Medicaid Other | Admitting: Advanced Practice Midwife

## 2021-08-23 ENCOUNTER — Other Ambulatory Visit: Payer: Self-pay

## 2021-08-24 ENCOUNTER — Encounter: Payer: Self-pay | Admitting: Women's Health

## 2021-08-24 ENCOUNTER — Ambulatory Visit (INDEPENDENT_AMBULATORY_CARE_PROVIDER_SITE_OTHER): Payer: Medicaid Other | Admitting: Women's Health

## 2021-08-24 ENCOUNTER — Other Ambulatory Visit: Payer: Self-pay

## 2021-08-24 ENCOUNTER — Encounter: Payer: Medicaid Other | Admitting: Obstetrics & Gynecology

## 2021-08-24 VITALS — BP 136/85 | HR 102 | Wt 233.0 lb

## 2021-08-24 DIAGNOSIS — O24419 Gestational diabetes mellitus in pregnancy, unspecified control: Secondary | ICD-10-CM

## 2021-08-24 DIAGNOSIS — O0993 Supervision of high risk pregnancy, unspecified, third trimester: Secondary | ICD-10-CM

## 2021-08-24 DIAGNOSIS — O0992 Supervision of high risk pregnancy, unspecified, second trimester: Secondary | ICD-10-CM

## 2021-08-24 DIAGNOSIS — Z23 Encounter for immunization: Secondary | ICD-10-CM

## 2021-08-24 DIAGNOSIS — O0990 Supervision of high risk pregnancy, unspecified, unspecified trimester: Secondary | ICD-10-CM

## 2021-08-24 LAB — POCT URINALYSIS DIPSTICK OB
Blood, UA: NEGATIVE
Glucose, UA: NEGATIVE
Ketones, UA: NEGATIVE
Leukocytes, UA: NEGATIVE
Nitrite, UA: NEGATIVE
POC,PROTEIN,UA: NEGATIVE

## 2021-08-24 MED ORDER — METFORMIN HCL 500 MG PO TABS: 500.0000 mg | ORAL_TABLET | Freq: Two times a day (BID) | ORAL | 3 refills | Status: DC

## 2021-08-24 NOTE — Progress Notes (Signed)
HIGH-RISK PREGNANCY VISIT Patient name: Shannon Diaz MRN [id number]  Date of birth: [birth year] Chief Complaint:   Routine Prenatal Visit and High Risk Gestation  History of Present Illness:   Shannon Diaz is a 30 y.o. [phone] female at [redacted]w[redacted]d with an Estimated Date of Delivery: 11/22/21 being seen today for ongoing management of a high-risk pregnancy complicated by diabetes mellitus A1DM.    Today she reports  didn't bring log, but reports fbs all >95 (110s), 2hr pp all >120 (120s) . Contractions: Irritability. Vag. Bleeding: None.  Movement: Present. denies leaking of fluid.   Depression screen [facility] 2/9 07/25/2021 05/30/2021 05/01/2016  Decreased Interest 0 0 0  Down, Depressed, Hopeless 0 0 0  PHQ - 2 Score 0 0 0  Altered sleeping 0 - -  Tired, decreased energy 0 - -  Change in appetite 0 - -  Feeling bad or failure about yourself  0 - -  Trouble concentrating 0 - -  Moving slowly or fidgety/restless 0 - -  Suicidal thoughts 0 - -  PHQ-9 Score 0 - -     GAD 7 : Generalized Anxiety Score 07/25/2021  Nervous, Anxious, on Edge 0  Control/stop worrying 0  Worry too much - different things 0  Trouble relaxing 0  Restless 0  Easily annoyed or irritable 0  Afraid - awful might happen 0  Total GAD 7 Score 0     Review of Systems:   Pertinent items are noted in HPI Denies abnormal vaginal discharge w/ itching/odor/irritation, headaches, visual changes, shortness of breath, chest pain, abdominal pain, severe nausea/vomiting, or problems with urination or bowel movements unless otherwise stated above. Pertinent History Reviewed:  Reviewed past medical,surgical, social, obstetrical and family history.  Reviewed problem list, medications and allergies. Physical Assessment:   Vitals:   08/24/21 1153  BP: 136/85  Pulse: (!) 102  Weight: 233 lb (105.7 kg)  Body mass index is 44.02 kg/m.           Physical Examination:   General appearance: alert, well  appearing, and in no distress  Mental status: alert, oriented to person, place, and time  Skin: warm & dry   Extremities: Edema: None    Cardiovascular: normal heart rate noted  Respiratory: normal respiratory effort, no distress  Abdomen: gravid, soft, non-tender  Pelvic: Cervical exam deferred         Fetal Status: Fetal Heart Rate (bpm): 145 Fundal Height: 29 cm Movement: Present    Fetal Surveillance Testing today: doppler   Chaperone: N/A    Results for orders placed or performed in visit on 08/24/21 (from the past 24 hour(s))  POC Urinalysis Dipstick OB   Collection Time: 08/24/21 11:55 AM  Result Value Ref Range   Color, UA     Clarity, UA     Glucose, UA Negative Negative   Bilirubin, UA     Ketones, UA neg    Spec Grav, UA     Blood, UA neg    pH, UA     POC,PROTEIN,UA Negative Negative, Trace, Small (1+), Moderate (2+), Large (3+), 4+   Urobilinogen, UA     Nitrite, UA neg    Leukocytes, UA Negative Negative   Appearance     Odor      Assessment & Plan:  High-risk pregnancy: [id number] at [redacted]w[redacted]d with an Estimated Date of Delivery: 11/22/21   1) A2DM, start metformin 500mg  BID  2) Prev SVB then c/s, wants TOLAC  3) Wants  BTL>reviewed risks/benefits, LARCs just as effective, consent signed today   Meds:  Meds ordered this encounter  Medications   metFORMIN (GLUCOPHAGE) 500 MG tablet    Sig: Take 1 tablet (500 mg total) by mouth 2 (two) times daily with a meal.    Dispense:  60 tablet    Refill:  3    Order Specific Question:   Supervising Provider    Answer:   [name] [[id number]]    Labs/procedures today: flu shot and tdap  Treatment Plan:    Growth u/s q4wks    2x/wk testing @ 32wks or weekly BPP    Deliver @ 39-39.6wks:______   Reviewed: Preterm labor symptoms and general obstetric precautions including but not limited to vaginal bleeding, contractions, leaking of fluid and fetal movement were reviewed in detail with the patient.  All questions were  answered. Does have home bp cuff. Office bp cuff given: not applicable. Check bp weekly, let us know if consistently >140 and/or >90.  Follow-up: Return in about 2 weeks (around 09/07/2021) for [place], US:EFW, MD or CNM, in person, Sign BTL consent today.   Future Appointments  Date Time Provider [place]  09/04/2021 10:15 AM [facility]  09/18/2021  9:45 AM Virl Cagey, MD [facility] None    Orders Placed This Encounter  Procedures   US OB Follow Up   Tdap vaccine greater than or equal to 7yo IM   Flu Vaccine QUAD 30mo+IM (Fluarix, Fluzone & Alfiuria Quad PF)   POC Urinalysis Dipstick OB   Roma Schanz CNM, [facility] 08/24/2021 12:19 PM

## 2021-08-24 NOTE — Patient Instructions (Signed)
Shannon Diaz, thank you for choosing our office today! We appreciate the opportunity to meet your healthcare needs. You may receive a short survey by mail, e-mail, or through [employer]. If you are happy with your care we would appreciate if you could take just a few minutes to complete the survey questions. We read all of your comments and take your feedback very seriously. Thank you again for choosing our office.  Center for [employer] Team at [facility]  [facility] at [facility] ([address] [place], [place] [address]) Entrance C, located off of [address] Free 24/7 valet parking   CLASSES: Go to [email or web address] to register for classes (childbirth, breastfeeding, waterbirth, infant CPR, daddy bootcamp, etc.)  Call the office [phone]) or go to [facility] if: You begin to have strong, frequent contractions Your water breaks.  Sometimes it is a big gush of fluid, sometimes it is just a trickle that keeps getting your panties wet or running down your legs You have vaginal bleeding.  It is normal to have a small amount of spotting if your cervix was checked.  You don't feel your baby moving like normal.  If you don't, get you something to eat and drink and lay down and focus on feeling your baby move.   If your baby is still not moving like normal, you should call the office or go to [facility].  Call the office [phone]) or go to [facility] hospital for these signs of pre-eclampsia: Severe headache that does not go away with Tylenol Visual changes- seeing spots, double, blurred vision Pain under your right breast or upper abdomen that does not go away with Tums or heartburn medicine Nausea and/or vomiting Severe swelling in your hands, feet, and face   Tdap Vaccine It is recommended that you get the Tdap vaccine during the third trimester of EACH pregnancy to help protect your baby from getting pertussis (whooping cough) 27-36 weeks is the BEST time to do  this so that you can pass the protection on to your baby. During pregnancy is better than after pregnancy, but if you are unable to get it during pregnancy it will be offered at the hospital.  You can get this vaccine with [place], at the health department, your family doctor, or some local pharmacies Everyone who will be around your baby should also be up-to-date on their vaccines before the baby comes. Adults (who are not pregnant) only need 1 dose of Tdap during adulthood.   [facility] Pediatricians/Family Doctors [facility]): [address] Colette Ribas, [phone]           [facility]: [address], [phone]                [facility]): [address], [phone] (call to ask if accepting patients) [facility] Department: [address], [place], [phone]    [facility] Pediatricians/Family Doctors [facility]): [phone] [address], [phone] [facility]: [address] [place], [phone] [facility]: [address], [phone]  [facility] Doctors  [facility]): [phone] [facility]: [address] [phone]   [facility] Doctors [facility]: [address] [phone]  [facility] Family Doctors  [facility]: [phone], [phone]  Home Blood Pressure Monitoring for Patients   Your provider has recommended that you check your  blood pressure (BP) at least once a week at home. If you do not have a blood pressure cuff at home, one will be provided for you. Contact your provider if you have not received your monitor within 1 week.   Helpful Tips for Accurate Home Blood Pressure Checks  Don't smoke, exercise, or drink caffeine 30 minutes before checking your BP Use the restroom before checking your BP (a full bladder can raise your  pressure) Relax in a comfortable upright chair Feet on the ground Left arm resting comfortably on a flat surface at the level of your heart Legs uncrossed Back supported Sit quietly and don't talk Place the cuff on your bare arm Adjust snuggly, so that only two fingertips can fit between your skin and the top of the cuff Check 2 readings separated by at least one minute Keep a log of your BP readings For a visual, please reference this diagram: http://ccnc.care/bpdiagram  Provider Name: [facility]     Phone: [phone]  Zone 1: ALL CLEAR  Continue to monitor your symptoms:  BP reading is less than 140 (top number) or less than 90 (bottom number)  No right upper stomach pain No headaches or seeing spots No feeling nauseated or throwing up No swelling in face and hands  Zone 2: CAUTION Call your doctor's office for any of the following:  BP reading is greater than 140 (top number) or greater than 90 (bottom number)  Stomach pain under your ribs in the middle or right side Headaches or seeing spots Feeling nauseated or throwing up Swelling in face and hands  Zone 3: EMERGENCY  Seek immediate medical care if you have any of the following:  BP reading is greater than160 (top number) or greater than 110 (bottom number) Severe headaches not improving with Tylenol Serious difficulty catching your breath Any worsening symptoms from Zone 2   Tercer trimestre de embarazo Third Trimester of Pregnancy El tercer trimestre de embarazo va desde la semana 28 hasta la semana 40. Esto corresponde a los meses 7 a 9. El tercer trimestre es un perodo en el que el beb en gestacin (feto) crece rpidamente. Hacia el final del noveno mes, el feto mide alrededor de 20 pulgadas (45 cm) de largo y pesa entre 6 y 10 libras (2.7 y 4.5 kg). Cambios en el cuerpo durante el tercer trimestre Durante el tercer trimestre, su cuerpo contina experimentando numerosos cambios. Los cambios varan y  generalmente vuelven a la normalidad despus del nacimiento del beb. Cambios fsicos Seguir American Standard Companies. Es de esperar que aumente entre 25 y 35 libras (11 y 16 kg) hacia el final del Psychiatrist si inicia el Psychiatrist con un peso normal. Si tiene bajo [place], es de esperar que aumente entre 28 y 40 libras (13 y 18 kg), y si tiene sobrepeso, es de esperar que aumente entre 15 y 25 libras (7 y 11 kg). Podrn aparecer las primeras Albertson's caderas, el abdomen y las [place]. Las ConAgra Foods seguirn creciendo y Writer. Un lquido amarillo Charity fundraiser) puede salir de sus pechos. Esta es la primera leche que usted produce para su beb. Tal vez haya cambios en el cabello. Esto cambios pueden incluir su engrosamiento, crecimiento rpido y Allied Waste Industries textura. A algunas personas tambin se les cae el cabello durante o despus del [place], o tienen el cabello seco o fino. El ombligo puede salir hacia afuera. Puede observar que se le Eli Lilly and Company, el rostro o los tobillos.  Cambios en la salud Es posible que tenga acidez estomacal. Puede sufrir estreimiento. Puede desarrollar hemorroides. Puede desarrollar venas hinchadas y abultadas (venas varicosas) en las piernas. Puede presentar ms dolor en la pelvis, la espalda o los muslos. Esto se debe al Citigroup de peso y al aumento de las hormonas que relajan las articulaciones. Puede presentar un aumento del hormigueo o entumecimiento en las manos, brazos y piernas. La piel de su abdomen tambin puede sentirse entumecida. Puede sentir que le falta el aire debido a que se expande el tero. Otros cambios Puede tener necesidad de Geographical information systems officer con ms frecuencia porque el feto baja hacia la pelvis y ejerce presin sobre la vejiga. Puede tener ms problemas para dormir. Esto puede deberse al tamao de su abdomen, una mayor necesidad de orinar y un aumento en el metabolismo de su cuerpo. Puede notar que el feto baja o lo siente ms bajo, en el abdomen  (aligeramiento). Puede tener un aumento de la secrecin vaginal. Puede notar que tiene dolor alrededor del hueso plvico a medida que el tero se distiende. Siga estas instrucciones en su casa: Medicamentos Siga las instrucciones del mdico en relacin con el uso de medicamentos. Durante el embarazo, hay medicamentos que pueden tomarse y otros que no. No tome ningn medicamento a menos que lo haya autorizado el mdico. Tome vitaminas prenatales que contengan por lo menos 600 microgramos (mcg) de cido flico. Comida y bebida Lleve una dieta saludable que incluya frutas y verduras frescas, cereales integrales, buenas fuentes de protenas como carnes [place], huevos o tofu, y productos lcteos descremados. Evite la carne cruda y el Bernie, la [place] y el queso sin Market researcher. Estos portan grmenes que pueden provocar dao tanto a usted como al beb. Tome 4 o 5 comidas pequeas en lugar de 3 comidas abundantes al da. Es posible que tenga que tomar estas medidas para prevenir o tratar el estreimiento: Product manager suficiente lquido como para Pharmacologist la orina de color amarillo plido. Consumir alimentos ricos en fibra, como frijoles, cereales integrales, y frutas y verduras frescas. Limitar el consumo de alimentos ricos en grasa y azcares procesados, como los alimentos fritos o dulces. Actividad Haga ejercicio solamente como se lo haya indicado el mdico. La mayora de las personas pueden continuar su actividad fsica habitual durante el [place]. Intente realizar como mnimo 30 minutos de actividad fsica por lo menos 5 das a la [place]. Deje de hacer ejercicio si experimenta contracciones en el tero. Deje de hacer ejercicio si le aparecen dolor o clicos en la parte baja del vientre o de la espalda. Evite levantar pesos Fortune Brands. No haga ejercicio si hace mucho calor o humedad, o si se encuentra a una altitud elevada. Si lo desea, puede seguir teniendo The [place] Travelers, salvo que el mdico le  indique lo contrario. Alivio del dolor y del [place] pausas frecuentes y descanse con las piernas levantadas (elevadas) si tiene calambres en las piernas o dolor en la parte baja de la espalda. Dese baos de asiento con agua tibia para Engineer, materials o las molestias causadas por las hemorroides. Use una crema para las hemorroides si el mdico la autoriza. Use un sujetador que le brinde buen soporte para prevenir las molestias causadas por la sensibilidad en las [place]. Si tiene venas varicosas: Use medias de compresin como se lo haya indicado el mdico. Eleve los pies durante 15 minutos, 3 o 4 veces por da. Limite el consumo de sal en su dieta. Seguridad Hable con su mdico antes de viajar  distancias largas. No se d baos de inmersin en agua caliente, baos turcos ni saunas. Use el cinturn de seguridad en todo momento mientras conduce o va en auto. Hable con el mdico si es vctima de [employer] Parts o fsico. Preparacin para el nacimiento Para prepararse para la llegada de su beb: Tome clases prenatales para entender, Education administrator, y hacer preguntas sobre el [place] de parto y [place]. Visite el hospital y recorra el rea de maternidad. Compre un asiento de seguridad FirstEnergy Corp, y asegrese de saber cmo instalarlo en su automvil. Prepare la habitacin o el lugar donde dormir el beb. Asegrese de quitar todas las almohadas y [place] de peluche de la cuna del beb para evitar la asfixia. Indicaciones generales Evite el contacto con las bandejas sanitarias de los gatos y la tierra que estos animales usan. Estos alimentos contienen bacterias que pueden causar defectos congnitos en el beb. Si tiene Financial controller, pdale a alguien que limpie la caja de arena por usted. No se haga lavados vaginales ni use tampones. No use toallas higinicas perfumadas. No consuma ningn producto que contenga nicotina o tabaco, como cigarrillos, cigarrillos electrnicos y tabaco de Theatre manager. Si  necesita ayuda para dejar de consumir estos productos, consulte al mdico. No use ningn remedio a base de hierbas, drogas ilegales o medicamentos que no le hayan sido recetados. Las sustancias qumicas de estos productos pueden daar al beb. No beba alcohol. Le realizarn exmenes prenatales ms frecuentes durante el tercer trimestre. Durante una visita prenatal de rutina, el mdico le har un examen fsico, Civil engineer, contracting pruebas y Heritage manager con usted de su salud general. Cumpla con todas las visitas de seguimiento. Esto es importante. Dnde buscar ms informacin American Pregnancy Association (Asociacin Estadounidense del Embarazo): americanpregnancy.org Celanese Corporation of Obstetricians and Gynecologists (Colegio Estadounidense de Obstetras y Alpha): EmploymentAssurance.cz? Office on Pitney Bowes (Oficina para la Salud de la Mujer): MightyReward.co.nz Comunquese con un mdico si tiene: Teacher, English as a foreign language. Clicos leves en la pelvis, presin en la pelvis o dolor persistente en la zona abdominal o la parte baja de la espalda. Vmitos o diarrea. Secrecin vaginal con mal olor u orina con mal olor. Dolor al Beatrix Shipper. Un dolor de cabeza que no desaparece despus de Science writer. Cambios en la visin o ve manchas delante de los ojos. Solicite ayuda de inmediato si: Rompe la bolsa. Tiene contracciones regulares separadas por menos de 5 minutos. Tiene sangrado o pequeas prdidas vaginales. Siente un dolor abdominal intenso. Tiene dificultad para respirar. Siente dolor en el pecho. Sufre episodios de Baxter International. No ha sentido a su beb moverse durante el perodo de Sempra Energy indic el mdico. Tiene dolor, hinchazn o enrojecimiento nuevos en un brazo o una pierna o se produce un aumento de alguno de estos sntomas. Resumen El tercer trimestre del Psychiatrist comprende desde la semana 28 hasta la semana 40 (desde el mes 7 hasta el mes 9). Puede tener ms problemas para dormir.  Esto puede deberse al tamao de su abdomen, una mayor necesidad de orinar y un aumento en el metabolismo de su cuerpo. Le realizarn exmenes prenatales ms frecuentes durante el tercer trimestre. Cumpla con todas las visitas de seguimiento. Esto es importante. Esta informacin no tiene Theme park manager el consejo del mdico. Asegrese de hacerle al mdico cualquier pregunta que tenga. Document Revised: 02/25/2020 Document Reviewed: 02/25/2020 Elsevier Patient Education  2022 Arvin[employer]itor.

## 2021-08-28 ENCOUNTER — Encounter: Payer: Self-pay | Admitting: Women's Health

## 2021-08-31 ENCOUNTER — Telehealth: Payer: Self-pay | Admitting: Family Medicine

## 2021-08-31 NOTE — Telephone Encounter (Signed)
Called patient to inform of change in appointment time and date due to sick provider with interpreter and there was no answer so a voicemail was eft and a letter was mailed out.

## 2021-09-04 ENCOUNTER — Other Ambulatory Visit: Payer: Medicaid Other

## 2021-09-12 ENCOUNTER — Ambulatory Visit (INDEPENDENT_AMBULATORY_CARE_PROVIDER_SITE_OTHER): Payer: Medicaid Other | Admitting: Advanced Practice Midwife

## 2021-09-12 ENCOUNTER — Other Ambulatory Visit: Payer: Self-pay

## 2021-09-12 ENCOUNTER — Ambulatory Visit (INDEPENDENT_AMBULATORY_CARE_PROVIDER_SITE_OTHER): Payer: Medicaid Other

## 2021-09-12 VITALS — BP 119/82 | HR 87 | Wt 235.4 lb

## 2021-09-12 DIAGNOSIS — O0992 Supervision of high risk pregnancy, unspecified, second trimester: Secondary | ICD-10-CM

## 2021-09-12 DIAGNOSIS — O24419 Gestational diabetes mellitus in pregnancy, unspecified control: Secondary | ICD-10-CM | POA: Diagnosis not present

## 2021-09-12 DIAGNOSIS — Z98891 History of uterine scar from previous surgery: Secondary | ICD-10-CM

## 2021-09-12 DIAGNOSIS — O0990 Supervision of high risk pregnancy, unspecified, unspecified trimester: Secondary | ICD-10-CM

## 2021-09-12 DIAGNOSIS — Z3A29 29 weeks gestation of pregnancy: Secondary | ICD-10-CM

## 2021-09-12 NOTE — Progress Notes (Signed)
[place] 29+6 wks,cephalic,posterior placenta gr 0,FHR 161 bpm,EFW 1732 g 84%,AC 97%,BPD 92%

## 2021-09-12 NOTE — Patient Instructions (Signed)
Shannon Diaz, thank you for choosing our office today! We appreciate the opportunity to meet your healthcare needs. You may receive a short survey by mail, e-mail, or through [employer]. If you are happy with your care we would appreciate if you could take just a few minutes to complete the survey questions. We read all of your comments and take your feedback very seriously. Thank you again for choosing our office.  Center for [employer] Team at [facility]  [facility] at [facility] ([address] [place], [place] [address]) Entrance C, located off of [address] Free 24/7 valet parking   CLASSES: Go to [email or web address] to register for classes (childbirth, breastfeeding, waterbirth, infant CPR, daddy bootcamp, etc.)  Call the office [phone]) or go to [facility] if: You begin to have strong, frequent contractions Your water breaks.  Sometimes it is a big gush of fluid, sometimes it is just a trickle that keeps getting your panties wet or running down your legs You have vaginal bleeding.  It is normal to have a small amount of spotting if your cervix was checked.  You don't feel your baby moving like normal.  If you don't, get you something to eat and drink and lay down and focus on feeling your baby move.   If your baby is still not moving like normal, you should call the office or go to [facility].  Call the office [phone]) or go to [facility] hospital for these signs of pre-eclampsia: Severe headache that does not go away with Tylenol Visual changes- seeing spots, double, blurred vision Pain under your right breast or upper abdomen that does not go away with Tums or heartburn medicine Nausea and/or vomiting Severe swelling in your hands, feet, and face   Tdap Vaccine It is recommended that you get the Tdap vaccine during the third trimester of EACH pregnancy to help protect your baby from getting pertussis (whooping cough) 27-36 weeks is the BEST time to do  this so that you can pass the protection on to your baby. During pregnancy is better than after pregnancy, but if you are unable to get it during pregnancy it will be offered at the hospital.  You can get this vaccine with [place], at the health department, your family doctor, or some local pharmacies Everyone who will be around your baby should also be up-to-date on their vaccines before the baby comes. Adults (who are not pregnant) only need 1 dose of Tdap during adulthood.   [facility] Pediatricians/Family Doctors [facility]): [address] Colette Ribas, [phone]           [facility]: [address], [phone]                [facility]): [address], [phone] (call to ask if accepting patients) [facility] Department: [address], [place], [phone]    [facility] Pediatricians/Family Doctors [facility]): [phone] [address], [phone] [facility]: [address] [place], [phone] [facility]: [address], [phone]  [facility] Doctors  [facility]): [phone] [facility]: [address] [phone]   [facility] Doctors [facility]: [address] [phone]  [facility] Family Doctors  [facility]: [phone], [phone]  Home Blood Pressure Monitoring for Patients   Your provider has recommended that you check your  blood pressure (BP) at least once a week at home. If you do not have a blood pressure cuff at home, one will be provided for you. Contact your provider if you have not received your monitor within 1 week.   Helpful Tips for Accurate Home Blood Pressure Checks  Don't smoke, exercise, or drink caffeine 30 minutes before checking your BP Use the restroom before checking your BP (a full bladder can raise your  pressure) Relax in a comfortable upright chair Feet on the ground Left arm resting comfortably on a flat surface at the level of your heart Legs uncrossed Back supported Sit quietly and don't talk Place the cuff on your bare arm Adjust snuggly, so that only two fingertips can fit between your skin and the top of the cuff Check 2 readings separated by at least one minute Keep a log of your BP readings For a visual, please reference this diagram: http://ccnc.care/bpdiagram  Provider Name: [facility]     Phone: [phone]  Zone 1: ALL CLEAR  Continue to monitor your symptoms:  BP reading is less than 140 (top number) or less than 90 (bottom number)  No right upper stomach pain No headaches or seeing spots No feeling nauseated or throwing up No swelling in face and hands  Zone 2: CAUTION Call your doctor's office for any of the following:  BP reading is greater than 140 (top number) or greater than 90 (bottom number)  Stomach pain under your ribs in the middle or right side Headaches or seeing spots Feeling nauseated or throwing up Swelling in face and hands  Zone 3: EMERGENCY  Seek immediate medical care if you have any of the following:  BP reading is greater than160 (top number) or greater than 110 (bottom number) Severe headaches not improving with Tylenol Serious difficulty catching your breath Any worsening symptoms from Zone 2   Third Trimester of Pregnancy The third trimester is from week 29 through week 42, months 7 through 9. The third trimester is a time when the fetus is growing rapidly. At the end of the ninth month, the fetus is about 20 inches in length and weighs 6-10 pounds.  BODY CHANGES Your body goes through many changes during pregnancy. The changes vary from woman to woman.  Your weight will continue to increase. You can expect to gain 25-35 pounds (11-16 kg) by the end of the pregnancy. You may begin to get stretch marks on your hips, abdomen,  and breasts. You may urinate more often because the fetus is moving lower into your pelvis and pressing on your bladder. You may develop or continue to have heartburn as a result of your pregnancy. You may develop constipation because certain hormones are causing the muscles that push waste through your intestines to slow down. You may develop hemorrhoids or swollen, bulging veins (varicose veins). You may have pelvic pain because of the weight gain and pregnancy hormones relaxing your joints between the bones in your pelvis. Backaches may result from overexertion of the muscles supporting your posture. You may have changes in your hair. These can include thickening of your hair, rapid growth, and changes in texture. Some women also have hair loss during or after pregnancy, or hair that feels dry or thin. Your hair will most likely return to normal after your baby is born. Your breasts will continue to grow and be tender. A yellow discharge may leak from your breasts called colostrum. Your belly button may stick out. You may   feel short of breath because of your expanding uterus. You may notice the fetus "dropping," or moving lower in your abdomen. You may have a bloody mucus discharge. This usually occurs a few days to a week before labor begins. Your cervix becomes thin and soft (effaced) near your due date. WHAT TO EXPECT AT YOUR PRENATAL EXAMS  You will have prenatal exams every 2 weeks until week 36. Then, you will have weekly prenatal exams. During a routine prenatal visit: You will be weighed to make sure you and the fetus are growing normally. Your blood pressure is taken. Your abdomen will be measured to track your baby's growth. The fetal heartbeat will be listened to. Any test results from the previous visit will be discussed. You may have a cervical check near your due date to see if you have effaced. At around 36 weeks, your caregiver will check your cervix. At the same time, your  caregiver will also perform a test on the secretions of the vaginal tissue. This test is to determine if a type of bacteria, Group B streptococcus, is present. Your caregiver will explain this further. Your caregiver may ask you: What your birth plan is. How you are feeling. If you are feeling the baby move. If you have had any abnormal symptoms, such as leaking fluid, bleeding, severe headaches, or abdominal cramping. If you have any questions. Other tests or screenings that may be performed during your third trimester include: Blood tests that check for low iron levels (anemia). Fetal testing to check the health, activity level, and growth of the fetus. Testing is done if you have certain medical conditions or if there are problems during the pregnancy. FALSE LABOR You may feel small, irregular contractions that eventually go away. These are called Braxton Hicks contractions, or false labor. Contractions may last for hours, days, or even weeks before true labor sets in. If contractions come at regular intervals, intensify, or become painful, it is best to be seen by your caregiver.  SIGNS OF LABOR  Menstrual-like cramps. Contractions that are 5 minutes apart or less. Contractions that start on the top of the uterus and spread down to the lower abdomen and back. A sense of increased pelvic pressure or back pain. A watery or bloody mucus discharge that comes from the vagina. If you have any of these signs before the 37th week of pregnancy, call your caregiver right away. You need to go to the hospital to get checked immediately. HOME CARE INSTRUCTIONS  Avoid all smoking, herbs, alcohol, and unprescribed drugs. These chemicals affect the formation and growth of the baby. Follow your caregiver's instructions regarding medicine use. There are medicines that are either safe or unsafe to take during pregnancy. Exercise only as directed by your caregiver. Experiencing uterine cramps is a good sign to  stop exercising. Continue to eat regular, healthy meals. Wear a good support bra for breast tenderness. Do not use hot tubs, steam rooms, or saunas. Wear your seat belt at all times when driving. Avoid raw meat, uncooked cheese, cat litter boxes, and soil used by cats. These carry germs that can cause birth defects in the baby. Take your prenatal vitamins. Try taking a stool softener (if your caregiver approves) if you develop constipation. Eat more high-fiber foods, such as fresh vegetables or fruit and whole grains. Drink plenty of fluids to keep your urine clear or pale yellow. Take warm sitz baths to soothe any pain or discomfort caused by hemorrhoids. Use hemorrhoid cream if  your caregiver approves. If you develop varicose veins, wear support hose. Elevate your feet for 15 minutes, 3-4 times a day. Limit salt in your diet. Avoid heavy lifting, wear low heal shoes, and practice good posture. Rest a lot with your legs elevated if you have leg cramps or low back pain. Visit your dentist if you have not gone during your pregnancy. Use a soft toothbrush to brush your teeth and be gentle when you floss. A sexual relationship may be continued unless your caregiver directs you otherwise. Do not travel far distances unless it is absolutely necessary and only with the approval of your caregiver. Take prenatal classes to understand, practice, and ask questions about the labor and delivery. Make a trial run to the hospital. Pack your hospital bag. Prepare the baby's nursery. Continue to go to all your prenatal visits as directed by your caregiver. SEEK MEDICAL CARE IF: You are unsure if you are in labor or if your water has broken. You have dizziness. You have mild pelvic cramps, pelvic pressure, or nagging pain in your abdominal area. You have persistent nausea, vomiting, or diarrhea. You have a bad smelling vaginal discharge. You have pain with urination. SEEK IMMEDIATE MEDICAL CARE IF:  You  have a fever. You are leaking fluid from your vagina. You have spotting or bleeding from your vagina. You have severe abdominal cramping or pain. You have rapid weight loss or gain. You have shortness of breath with chest pain. You notice sudden or extreme swelling of your face, hands, ankles, feet, or legs. You have not felt your baby move in over an hour. You have severe headaches that do not go away with medicine. You have vision changes. Document Released: 08/13/2001 Document Revised: 08/24/2013 Document Reviewed: 10/20/2012 [facility] Patient Information 2015 [place], [place]. This information is not intended to replace advice given to you by your health care provider. Make sure you discuss any questions you have with your health care provider.

## 2021-09-12 NOTE — Progress Notes (Addendum)
HIGH-RISK PREGNANCY VISIT Patient name: Shannon Diaz MRN [id number]  Date of birth: Aug 02, 1991 Chief Complaint:   Routine Prenatal Visit  History of Present Illness:   Shannon Diaz is a 31 y.o. [phone] female at [redacted]w[redacted]d with an Estimated Date of Delivery: 11/22/21 being seen today for ongoing management of a high-risk pregnancy complicated by diabetes mellitus A2DM currently on Metformin 500mg  bid .  Brought log with 2.5wks of values: all fastings <96; 6/54 2hr values were high (122, 130). States that she only takes Metformin sporadically due to GI side effects.  Today she reports no complaints. Contractions: Irritability. Vag. Bleeding: None.  Movement: Present. denies leaking of fluid.   Depression screen [facility] 2/9 07/25/2021 05/30/2021 05/01/2016  Decreased Interest 0 0 0  Down, Depressed, Hopeless 0 0 0  PHQ - 2 Score 0 0 0  Altered sleeping 0 - -  Tired, decreased energy 0 - -  Change in appetite 0 - -  Feeling bad or failure about yourself  0 - -  Trouble concentrating 0 - -  Moving slowly or fidgety/restless 0 - -  Suicidal thoughts 0 - -  PHQ-9 Score 0 - -     GAD 7 : Generalized Anxiety Score 07/25/2021  Nervous, Anxious, on Edge 0  Control/stop worrying 0  Worry too much - different things 0  Trouble relaxing 0  Restless 0  Easily annoyed or irritable 0  Afraid - awful might happen 0  Total GAD 7 Score 0     Review of Systems:   Pertinent items are noted in HPI Denies abnormal vaginal discharge w/ itching/odor/irritation, headaches, visual changes, shortness of breath, chest pain, abdominal pain, severe nausea/vomiting, or problems with urination or bowel movements unless otherwise stated above. Pertinent History Reviewed:  Reviewed past medical,surgical, social, obstetrical and family history.  Reviewed problem list, medications and allergies. Physical Assessment:   Vitals:   09/12/21 1132  BP: 119/82  Pulse: 87  Weight: 235 lb 6.4 oz (106.8  kg)  Body mass index is 44.48 kg/m.           Physical Examination:   General appearance: alert, well appearing, and in no distress  Mental status: alert, oriented to person, place, and time  Skin: warm & dry   Extremities: Edema: None    Cardiovascular: normal heart rate noted  Respiratory: normal respiratory effort, no distress  Abdomen: gravid, soft, non-tender  Pelvic: Cervical exam deferred         Fetal Status: Fetal Heart Rate (bpm): 161 u/s   Movement: Present    Fetal Surveillance Testing today: [place] [id number] wks,cephalic,posterior placenta gr 0,FHR 161 bpm,EFW 1732 g 84%,AC 97%,BPD 92%    No results found for this or any previous visit (from the past 24 hour(s)).  Assessment & Plan:  High-risk pregnancy: [id number] at [redacted]w[redacted]d with an Estimated Date of Delivery: 11/22/21   1) GDMA2, stable values without regularly taking Metformin; takes it only if a value is high- discussed the time effectiveness of Metformin and how it isn't effective immediately, however her values are almost all within range; EFW 84th% today; answered questions re macrosomia, SD, and when a C/S would be recommended  2) Previous C/S, for abruption; still planning on TOLAC  3) Previous elevated BP, nl today  4) Wants BTL, consent last visit; wants ligation; discussed may need to be done outpatient due to abd size  Meds: No orders of the defined types were placed in this encounter.   Labs/procedures  today: U/S  Treatment Plan:  Start ante testing @ 32wks (will schedule out); growth q 4wks; IOL 39-39.6wks  Reviewed: Preterm labor symptoms and general obstetric precautions including but not limited to vaginal bleeding, contractions, leaking of fluid and fetal movement were reviewed in detail with the patient.  All questions were answered. Does have home bp cuff. Office bp cuff given: not applicable. Check bp weekly, let us know if consistently >140 and/or >90.  Follow-up: Return for starting in 2 weeks- HROB with  either BPP or NST (2x/wk)- sched out.   Future Appointments  Date Time Provider [place]  09/13/2021  2:15 PM [facility]  09/18/2021  9:45 AM Virl Cagey, MD [facility] None    No orders of the defined types were placed in this encounter.  Myrtis Ser CNM 09/12/2021 12:03 PM

## 2021-09-13 ENCOUNTER — Ambulatory Visit: Payer: Medicaid Other | Admitting: Registered"

## 2021-09-13 DIAGNOSIS — O24419 Gestational diabetes mellitus in pregnancy, unspecified control: Secondary | ICD-10-CM

## 2021-09-13 NOTE — Progress Notes (Signed)
Patient declined interpreter  Patient was seen for Gestational Diabetes self-management on 09/13/2021 Start time 1425 and End time 1437  Estimated due date: 11/22/21; [redacted]w[redacted]d  Clinical: Medications: reviewed Medical History: prediabetes Labs: OGTT 95(H)-128-89, A1c 5.7%   SMBG, patient has picture of log on her phone FBS: WNL Postprandial: mostly WNL  Dietary and Lifestyle History: Patient states she has made suggested changes since last visit including increasing her protein intake and is able to eat meat without having a rash and has also started eating yogurt. Pt states she stopped drinking (WIC) juice.  Patient states her foot is healing and able to exercise more. Pt states MD did not give her an Rx for [facility] Ensure, but she has been purchasing and drinking about 1 per day.  Pt states since she has more energy now and doesn't need to sleep during the day. Patient states nausea is not as bad and no vomiting, pt states MD thought migraines may be contributing to nausea.   Pt states has some symptoms similar to hypoglycemia but attributes blood pressure (preeclampsia) but has resolved with medication.  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  24 hr Recall: (some choices are due to [facility] approved foods) First Meal: (drinks Ensure when taking kids to school) cereal, milk Snack:  Second meal: rice, beans, meat Snack: fruit, yogurt Third meal: Beef, beans, 2 tortillas Snack: fruit Beverages: milk, water (8-10 bottles of water)  NUTRITION INTERVENTION  Nutrition education (E-1) on the following topics:   Initial Follow-up  [x]  []  Definition of Gestational Diabetes []  []  Why dietary management is important in controlling blood glucose [x]  []  Effects each nutrient has on blood glucose levels []  []  Simple carbohydrates vs complex carbohydrates [x]  [x]  Fluid  intake [x]  []  Creating a balanced meal plan [x]  []  Carbohydrate counting  [x]  []  When to check blood glucose levels [x]  []  Proper blood glucose monitoring techniques [x]  []  Effect of stress and stress reduction techniques  [x]  []  Exercise effect on blood glucose levels, appropriate exercise during pregnancy []  []  Importance of limiting caffeine and abstaining from alcohol and smoking [x]  []  Medications used for blood sugar control during pregnancy []  [x]  Hypoglycemia and rule of 15 [x]  []  Postpartum self care    Patient instructed to monitor glucose levels: FBS: 60 - ? 95 mg/dL (some clinics use 90 for cutoff) 1 hour: ? 140 mg/dL 2 hour: ? mg/dL  Patient received handouts: none  Patient will likely need medication to control. Patient will be seen for follow-up in 2 weeks or as needed.

## 2021-09-18 ENCOUNTER — Other Ambulatory Visit: Payer: Self-pay

## 2021-09-18 ENCOUNTER — Ambulatory Visit (INDEPENDENT_AMBULATORY_CARE_PROVIDER_SITE_OTHER): Payer: Medicaid Other | Admitting: General Surgery

## 2021-09-18 ENCOUNTER — Encounter: Payer: Self-pay | Admitting: General Surgery

## 2021-09-18 VITALS — BP 136/90 | HR 93 | Temp 97.8°F | Resp 18 | Ht 61.0 in | Wt 231.0 lb

## 2021-09-18 DIAGNOSIS — L980 Pyogenic granuloma: Secondary | ICD-10-CM

## 2021-09-18 NOTE — Patient Instructions (Signed)
PRN Follow up Ok to shower, bathe, swim etc

## 2021-09-18 NOTE — Progress Notes (Signed)
[facility]  Doing well. No issues or complaints. BP 136/90    Pulse 93    Temp 97.8 F (36.6 C) (Other (Comment))    Resp 18    Ht 5\' 1"  (1.549 m)    Wt 231 lb (104.8 kg)    LMP 03/26/2021 (Approximate)    SpO2 98%    BMI 43.65 kg/m  Wound healed, new epithelized skin in the location    Patient s/p excision of a pyogenic granuloma of the left leg. Doing well PRN Follow up Ok to shower, bathe, swim etc  03/28/2021, MD [facility] [address] [place], [place] [phone] (office)

## 2021-09-27 ENCOUNTER — Other Ambulatory Visit: Payer: Self-pay

## 2021-09-27 ENCOUNTER — Ambulatory Visit (INDEPENDENT_AMBULATORY_CARE_PROVIDER_SITE_OTHER): Payer: Medicaid Other | Admitting: Obstetrics & Gynecology

## 2021-09-27 ENCOUNTER — Encounter: Payer: Self-pay | Admitting: Obstetrics & Gynecology

## 2021-09-27 VITALS — BP 124/88 | HR 113 | Wt 237.6 lb

## 2021-09-27 DIAGNOSIS — Z3A32 32 weeks gestation of pregnancy: Secondary | ICD-10-CM

## 2021-09-27 DIAGNOSIS — O0990 Supervision of high risk pregnancy, unspecified, unspecified trimester: Secondary | ICD-10-CM | POA: Diagnosis not present

## 2021-09-27 LAB — POCT URINALYSIS DIPSTICK OB
Blood, UA: NEGATIVE
Glucose, UA: NEGATIVE
Ketones, UA: NEGATIVE
Nitrite, UA: NEGATIVE
POC,PROTEIN,UA: NEGATIVE

## 2021-09-27 NOTE — Progress Notes (Signed)
HIGH-RISK PREGNANCY VISIT Patient name: Shannon Diaz MRN [id number]  Date of birth: May 27, 1991 Chief Complaint:   High Risk Gestation (NST today; + headache; Tylenol not helping)  History of Present Illness:   Shannon Diaz is a 31 y.o. 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  -prior C-section- desires TOLAC.    Today she reports headache- states it comes/goes- at time may last for 2 days.  Minimal improvement with tylenol.  No change in vision.  Pt has checked BP and sugars when she is having her headache and both are normal  Contractions: Irritability. Vag. Bleeding: None.  Movement: Present. denies leaking of fluid.   [facility] 07/25/2021 05/30/2021 05/01/2016  Decreased Interest 0 0 0  Down, Depressed, Hopeless 0 0 0  PHQ - 2 Score 0 0 0  Altered sleeping 0 - -  Tired, decreased energy 0 - -  Change in appetite 0 - -  Feeling bad or failure about yourself  0 - -  Trouble concentrating 0 - -  Moving slowly or fidgety/restless 0 - -  Suicidal thoughts 0 - -  PHQ-9 Score 0 - -     Current Outpatient Medications  Medication Instructions   Accu-Chek Softclix Lancets lancets Check blood sugar 4 times a day.   aspirin EC 81 mg, Oral, Daily, Take after 12 weeks for prevention of preeclampsia later in pregnancy   blood glucose meter kit and supplies Dispense based on patient and insurance preference. Use four times daily as directed to check blood sugar (FOR ICD-10 E10.9, E11.9).   glucose blood (ACCU-CHEK GUIDE) test strip Check blood sugar four times daily   metFORMIN (GLUCOPHAGE) 500 mg, Oral, 2 times daily with meals   prenatal vitamin w/FE, FA (PRENATAL 1 + 1) 27-1 MG TABS tablet 1 tablet, Oral, Daily      Review of Systems:   Pertinent items are noted in HPI Denies abnormal vaginal discharge w/ itching/odor/irritation, shortness of breath, chest pain, abdominal pain, severe nausea/vomiting, or problems with urination or bowel movements unless otherwise stated above. Pertinent History Reviewed:  Reviewed past medical,surgical, social, obstetrical and family history.  Reviewed problem list, medications and allergies. Physical Assessment:   Vitals:   09/27/21 0930  BP: 124/88  Pulse: (!) 113  Weight: 237 lb 9.6 oz (107.8 kg)  Body mass index is 44.89 kg/m.           Physical Examination:   General appearance: alert, well appearing, and in no distress  Mental status: normal mood, behavior, speech, dress, motor activity, and thought processes  Skin: warm & dry   Extremities: Edema: Trace    Cardiovascular: normal heart rate noted  Respiratory: normal respiratory effort, no distress  Abdomen: gravid, soft, non-tender  Pelvic: Cervical exam deferred         Fetal Status:     Movement: Present    Fetal Surveillance Testing today: NST   Chaperone: N/A    NST being performed due to GDMA2   Fetal Monitoring:  Baseline: 140 bpm, Variability: moderate, Accelerations: +10x10 x 2 and Decelerations: Absent   Non-reactive in that accels are not 15x15; however FHT reassuring and appropriate for GA of 3218w0dinal diagnosis:  Equivocal NST  Assessment & Plan:  High-risk pregnancy: [id number] at 81w0dwith an Estimated Date of Delivery: 11/22/21   1) GDMA2 -continue metformin 5039mbid -strongly encouraged pt to bring log every visit -antepartum twice weekly and growth q 4wks   Meds: No orders of the defined types were placed in this encounter.   Labs/procedures today: NST  Treatment Plan:  as outlined above  Reviewed: Preterm labor symptoms and general obstetric precautions including but not limited to vaginal bleeding, contractions, leaking of fluid and fetal movement were  reviewed in detail with the patient.  All questions were answered. PT has home bp cuff. Check bp weekly, let usKoreanow if >140/90.   Follow-up: Return for HROB visit weekly AND twice weekly NST/BPP (SCHEDULED).   Future Appointments  Date Time Provider DeWalton1/30/2023 10:10 AM [facility]-FTOBGYN NURSE [facility]  10/04/2021 10:10 AM EuFlorian BuffMD [facility]-FT [facility]TO[facility]YN  10/08/2021 10:30 AM [facility]-FTOBGYN NURSE [facility]  10/11/2021  9:10 AM CrChristin FudgeCNM [facility]-FT [facility]TOBGYN  10/15/2021 10:30 AM [facility]-FTOBGYN NURSE [facility]  10/18/2021 10:30 AM C[facility] - FTOBGYN [place]-FTIMG None  10/18/2021 11:50 AM BoRoma SchanzCNM [facility]-FT FTOBGYN  10/22/2021 10:10 AM [facility]-FT[facility]GYN NURSE [facility]  10/25/2021  9:50 AM OzJanyth PupaDO [facility]-FT [facility]TO[facility]YN  10/29/2021 10:30 AM [facility]-FTOBGYN NURSE [facility]  11/01/2021 10:10 AM BoRoma SchanzCNM [facility]-FT [facility]TOBGYN  11/05/2021  9:10 AM [facility]-FTOBGYN NURSE [facility]TO[facility]  11/08/2021  9:45 AM [place] FTO[facility] [place]FTIM[place] None  11/08/2021 10:50 AM OzJanyth PupaDO [facility]-FT [facility]TO[facility]  11/12/2021  9:50 AM [facility]-FTOBGYN NURSE [facility]-FT [facility]TO[facility]  11/15/2021  9:30 AM [facility]-FTOBGYN NURSE [facility]-FT [facility]TO[facility]  11/19/2021  9:30 AM [facility]-FTOBGYN NURSE [facility]  11/22/2021  9:50 AM Cresenzo-Dishmon, FrJoaquim LaiCNM C[facility]-FT FTOBGYN    Orders Placed This Encounter  Procedures   POC Urinalysis Dipstick OB    JeJanyth PupaDO Attending ObMendonFaCavaleroor WoDean Foods CompanyCoDentonroup

## 2021-10-01 ENCOUNTER — Other Ambulatory Visit: Payer: Self-pay

## 2021-10-01 ENCOUNTER — Ambulatory Visit (INDEPENDENT_AMBULATORY_CARE_PROVIDER_SITE_OTHER): Payer: Medicaid Other | Admitting: *Deleted

## 2021-10-01 VITALS — BP 127/82 | HR 114 | Wt 235.6 lb

## 2021-10-01 DIAGNOSIS — Z3A32 32 weeks gestation of pregnancy: Secondary | ICD-10-CM | POA: Diagnosis not present

## 2021-10-01 DIAGNOSIS — O0993 Supervision of high risk pregnancy, unspecified, third trimester: Secondary | ICD-10-CM | POA: Diagnosis not present

## 2021-10-01 DIAGNOSIS — O0990 Supervision of high risk pregnancy, unspecified, unspecified trimester: Secondary | ICD-10-CM

## 2021-10-01 DIAGNOSIS — O24419 Gestational diabetes mellitus in pregnancy, unspecified control: Secondary | ICD-10-CM

## 2021-10-01 LAB — POCT URINALYSIS DIPSTICK OB
Blood, UA: NEGATIVE
Glucose, UA: NEGATIVE
Ketones, UA: NEGATIVE
Leukocytes, UA: NEGATIVE
Nitrite, UA: NEGATIVE

## 2021-10-01 NOTE — Progress Notes (Signed)
° °  NURSE VISIT- NST  SUBJECTIVE:  Shannon Diaz is a 31 y.o. [phone] female at [redacted]w[redacted]d, here for a NST for pregnancy complicated by A2DM currently on Metformin .  She reports active fetal movement, contractions: none, vaginal bleeding: none, membranes: intact.   OBJECTIVE:  BP 127/82    Pulse (!) 114    Wt 235 lb 9.6 oz (106.9 kg)    LMP 03/26/2021 (Approximate)    BMI 44.52 kg/m   Appears well, no apparent distress  Results for orders placed or performed in visit on 10/01/21 (from the past 24 hour(s))  POC Urinalysis Dipstick OB   Collection Time: 10/01/21 10:19 AM  Result Value Ref Range   Color, UA     Clarity, UA     Glucose, UA Negative Negative   Bilirubin, UA     Ketones, UA neg    Spec Grav, UA     Blood, UA neg    pH, UA     POC,PROTEIN,UA Trace Negative, Trace, Small (1+), Moderate (2+), Large (3+), 4+   Urobilinogen, UA     Nitrite, UA neg    Leukocytes, UA Negative Negative   Appearance     Odor      NST: FHR baseline 150 bpm, Variability: moderate, Accelerations:present, Decelerations:  Absent= Cat 1/reactive Toco: none   ASSESSMENT: [id number] at [redacted]w[redacted]d with A2DM currently on Metformin NST reactive  PLAN: EFM strip reviewed by Dr. [name] Hidden   Recommendations: keep next appointment as scheduled    [name]  10/01/2021 1:51 PM

## 2021-10-04 ENCOUNTER — Encounter: Payer: Self-pay | Admitting: Obstetrics & Gynecology

## 2021-10-04 ENCOUNTER — Ambulatory Visit (INDEPENDENT_AMBULATORY_CARE_PROVIDER_SITE_OTHER): Payer: Medicaid Other | Admitting: Obstetrics & Gynecology

## 2021-10-04 ENCOUNTER — Other Ambulatory Visit: Payer: Self-pay

## 2021-10-04 VITALS — BP 139/87 | HR 104 | Wt 241.0 lb

## 2021-10-04 DIAGNOSIS — Z3A33 33 weeks gestation of pregnancy: Secondary | ICD-10-CM

## 2021-10-04 DIAGNOSIS — O09893 Supervision of other high risk pregnancies, third trimester: Secondary | ICD-10-CM | POA: Diagnosis not present

## 2021-10-04 DIAGNOSIS — O24419 Gestational diabetes mellitus in pregnancy, unspecified control: Secondary | ICD-10-CM | POA: Diagnosis not present

## 2021-10-04 NOTE — Progress Notes (Signed)
HIGH-RISK PREGNANCY VISIT Patient name: Shannon Diaz MRN [id number]  Date of birth: [birth year] Chief Complaint:   Routine Prenatal Visit (nst)  History of Present Illness:   Shannon Diaz is a 31 y.o. [phone] female at [redacted]w[redacted]d with an Estimated Date of Delivery: 11/22/21 being seen today for ongoing management of a high-risk pregnancy complicated by diabetes mellitus A2DM currently on metformin 500 BID .    Today she reports no complaints. Contractions: Not present. Vag. Bleeding: None.  Movement: Present. denies leaking of fluid.   Depression screen [facility] 2/9 07/25/2021 05/30/2021 05/01/2016  Decreased Interest 0 0 0  Down, Depressed, Hopeless 0 0 0  PHQ - 2 Score 0 0 0  Altered sleeping 0 - -  Tired, decreased energy 0 - -  Change in appetite 0 - -  Feeling bad or failure about yourself  0 - -  Trouble concentrating 0 - -  Moving slowly or fidgety/restless 0 - -  Suicidal thoughts 0 - -  PHQ-9 Score 0 - -     GAD 7 : Generalized Anxiety Score 07/25/2021  Nervous, Anxious, on Edge 0  Control/stop worrying 0  Worry too much - different things 0  Trouble relaxing 0  Restless 0  Easily annoyed or irritable 0  Afraid - awful might happen 0  Total GAD 7 Score 0     Review of Systems:   Pertinent items are noted in HPI Denies abnormal vaginal discharge w/ itching/odor/irritation, headaches, visual changes, shortness of breath, chest pain, abdominal pain, severe nausea/vomiting, or problems with urination or bowel movements unless otherwise stated above. Pertinent History Reviewed:  Reviewed past medical,surgical, social, obstetrical and family history.  Reviewed problem list, medications and allergies. Physical Assessment:   Vitals:   10/04/21 1020  BP: 139/87  Pulse: (!) 104  Weight: 241 lb (109.3 kg)  Body mass index is 45.54 kg/m.           Physical Examination:   General appearance: alert, well appearing, and in no distress  Mental status: alert,  oriented to person, place, and time  Skin: warm & dry   Extremities: Edema: Trace    Cardiovascular: normal heart rate noted  Respiratory: normal respiratory effort, no distress  Abdomen: gravid, soft, non-tender  Pelvic: Cervical exam deferred         Fetal Status:     Movement: Present    Fetal Surveillance Testing today: Reactive   Shannon Diaz is at [redacted]w[redacted]d Estimated Date of Delivery: 11/22/21  NST being performed due to A2DM  Today the NST is Reactive  Fetal Monitoring:  Baseline: 140 bpm, Variability: Good {> 6 bpm), Accelerations: Reactive, and Decelerations: Absent   reactive  The accelerations are >15 bpm and more than 2 in 20 minutes  Final diagnosis:  Reactive NST  [name], MD     Chaperone: N/A    No results found for this or any previous visit (from the past 24 hour(s)).  Assessment & Plan:  High-risk pregnancy: [id number] at [redacted]w[redacted]d with an Estimated Date of Delivery: 11/22/21   1) A2DM well controlled on metformin 500 BID,   2) Borderline HTN, continue to monitor,   Meds: No orders of the defined types were placed in this encounter.   Labs/procedures today: NST  Treatment Plan:  twice weekly surveillance  Reviewed: Preterm labor symptoms and general obstetric precautions including but not limited to vaginal bleeding, contractions, leaking of fluid and fetal movement were reviewed in detail with  the patient.  All questions were answered. Does have home bp cuff. Office bp cuff given: yes. Check bp daily, let us know if consistently >140 and/or >90.  Follow-up: No follow-ups on file.   Future Appointments  Date Time Provider [place]  10/08/2021 10:30 AM [facility]-FTOBGYN NURSE [facility]FT [facility]  10/11/2021  9:10 AM Christin Fudge, CNM [facility]-FT FTOBGYN  10/15/2021 10:30 AM C[facility]-FTOBGYN NURSE [facility]FT [facility]TO[facility]  10/18/2021 10:30 AM C[facility] - FTOBGYN [place] C[facility]-FTIMG None  10/18/2021 11:50 AM Roma Schanz, CNM [facility]FT [facility]TO[facility]  10/22/2021 10:10 AM  C[facility]-FTOBGYN NURSE [facility]-FT [facility]TO[facility]  10/25/2021  9:50 AM Janyth Pupa, DO [facility]-FT FTOBGYN  10/29/2021 10:30 AM C[facility]-FTOBGYN NURSE [facility]-FT [facility]TO[facility]  11/01/2021 10:10 AM Roma Schanz, CNM [facility]FT FTO[facility]  11/05/2021  9:10 AM C[facility]-FTOBGYN NURSE [facility]-FT [facility]TO[facility]  11/08/2021  9:45 AM [place] FTOBGYN [place] CWH-FTIMG None  11/08/2021 10:50 AM Janyth Pupa, DO [facility]-FT FTOBGYN  11/12/2021  9:50 AM C[facility]-FTOBGYN NURSE C[facility]-FT FTOBGYN  11/15/2021  9:30 AM C[facility]-FTOBGYN NURSE C[facility]-FT FTOBG[facility]  11/19/2021  9:30 AM C[facility]-FTOBGYN NURSE C[facility]-FT FTOBGYN  11/22/2021  9:50 AM Cresenzo-Dishmon, Joaquim Lai, CNM C[facility]-FT FTOBGYN    Orders Placed This Encounter  Procedures   POC Urinalysis Dipstick OB   [name]  Attending Physician for [facility] 10/04/2021 11:21 AM

## 2021-10-08 ENCOUNTER — Other Ambulatory Visit: Payer: Medicaid Other

## 2021-10-11 ENCOUNTER — Other Ambulatory Visit: Payer: Medicaid Other | Admitting: Advanced Practice Midwife

## 2021-10-11 ENCOUNTER — Ambulatory Visit (INDEPENDENT_AMBULATORY_CARE_PROVIDER_SITE_OTHER): Payer: Medicaid Other | Admitting: Obstetrics & Gynecology

## 2021-10-11 ENCOUNTER — Other Ambulatory Visit: Payer: Self-pay

## 2021-10-11 ENCOUNTER — Encounter: Payer: Self-pay | Admitting: Obstetrics & Gynecology

## 2021-10-11 VITALS — BP 127/75 | HR 86 | Wt 237.4 lb

## 2021-10-11 DIAGNOSIS — O0993 Supervision of high risk pregnancy, unspecified, third trimester: Secondary | ICD-10-CM

## 2021-10-11 DIAGNOSIS — O24415 Gestational diabetes mellitus in pregnancy, controlled by oral hypoglycemic drugs: Secondary | ICD-10-CM

## 2021-10-11 LAB — POCT URINALYSIS DIPSTICK OB
Blood, UA: NEGATIVE
Glucose, UA: NEGATIVE
Ketones, UA: NEGATIVE
Nitrite, UA: NEGATIVE

## 2021-10-11 NOTE — Progress Notes (Signed)
HIGH-RISK PREGNANCY VISIT Patient name: Shannon Diaz MRN [id number]  Date of birth: [birth year] Chief Complaint:   Routine Prenatal Visit, High Risk Gestation, and Non-stress Test  History of Present Illness:   Shannon Diaz is a 31 y.o. [phone] female at 58w0dwith an Estimated Date of Delivery: 11/22/21 being seen today for ongoing management of a high-risk pregnancy complicated by:  GDMA2- on metformin 5087mbid Sugars within normal range-all 120 or below Prior C-section- desires TOLAC cHTN- no meds.    Today she reports backache- intermittent, denies regular contractions.  Contractions: Irritability. Vag. Bleeding: None.  Movement: Present. denies leaking of fluid.   [facility] 07/25/2021 05/30/2021 05/01/2016  Decreased Interest 0 0 0  Down, Depressed, Hopeless 0 0 0  PHQ - 2 Score 0 0 0  Altered sleeping 0 - -  Tired, decreased energy 0 - -  Change in appetite 0 - -  Feeling bad or failure about yourself  0 - -  Trouble concentrating 0 - -  Moving slowly or fidgety/restless 0 - -  Suicidal thoughts 0 - -  PHQ-9 Score 0 - -     Current Outpatient Medications  Medication Instructions   Accu-Chek Softclix Lancets lancets Check blood sugar 4 times a day.   aspirin EC 81 mg, Oral, Daily, Take after 12 weeks for prevention of preeclampsia later in pregnancy   blood glucose meter kit and supplies Dispense based on patient and insurance preference. Use four times daily as directed to check blood sugar (FOR ICD-10 E10.9, E11.9).   glucose blood (ACCU-CHEK GUIDE) test strip Check blood sugar four times daily   metFORMIN (GLUCOPHAGE) 500 mg, Oral, 2 times daily with meals   prenatal vitamin w/FE, FA (PRENATAL 1 + 1) 27-1 MG TABS tablet 1 tablet, Oral, Daily     Review of Systems:   Pertinent items are noted in HPI Denies abnormal vaginal discharge w/ itching/odor/irritation, headaches, visual changes, shortness of breath, chest pain, abdominal  pain, severe nausea/vomiting, or problems with urination or bowel movements unless otherwise stated above. Pertinent History Reviewed:  Reviewed past medical,surgical, social, obstetrical and family history.  Reviewed problem list, medications and allergies. Physical Assessment:   Vitals:   10/11/21 1130  BP: 127/75  Pulse: 86  Weight: 237 lb 6.4 oz (107.7 kg)  Body mass index is 44.86 kg/m.           Physical Examination:   General appearance: alert, well appearing, and in no distress  Mental status: normal mood, behavior, speech, dress, motor activity, and thought processes  Skin: warm & dry   Extremities: Edema: Trace    Cardiovascular: normal heart rate noted  Respiratory: normal respiratory effort, no distress  Abdomen: gravid, obese, soft, non-tender  Pelvic: Cervical exam deferred         Fetal Status:     Movement: Present    Fetal Surveillance Testing today: NST   NST being performed due to [facility] Monitoring:  Baseline: 150 bpm, Variability: moderate, Accelerations: present, The accelerations are >15 bpm and more than 2 in 20 minutes, and Decelerations: Absent    Final diagnosis:  Reactive NST   Chaperone: N/A    Results for orders placed or performed in visit on 10/11/21 (from the past 24 hour(s))  POC Urinalysis Dipstick OB   Collection Time: 10/11/21 11:49 AM  Result Value Ref Range   Color, UA     Clarity, UA     Glucose, UA Negative  Negative   Bilirubin, UA     Ketones, UA neg    Spec Grav, UA     Blood, UA neg    pH, UA     POC,PROTEIN,UA Trace Negative, Trace, Small (1+), Moderate (2+), Large (3+), 4+   Urobilinogen, UA     Nitrite, UA neg    Leukocytes, UA Moderate (2+) (A) Negative   Appearance     Odor       Assessment & Plan:  High-risk pregnancy: [id number] at 19w0dwith an Estimated Date of Delivery: 11/22/21   1) GDMA2 -continue metformin bid -continue antepartum testing weekly and growth q 4wks -IOL @ 39wk  2) TOLAC- reviewed  risk/benefit including risk of bleeding, infection, uterine rupture and potential serious maternal or neonatal consequences.  Entire form reviewed and consent obtained. Discussed if VBAC would still need surgery for tubal ligation vs consideration for RLTCS and BTL at same time.  Pt desires TOLAC, but will review her options.  Meds: No orders of the defined types were placed in this encounter.   Labs/procedures today: NST  Treatment Plan:  as outlined above  Reviewed: Preterm labor symptoms and general obstetric precautions including but not limited to vaginal bleeding, contractions, leaking of fluid and fetal movement were reviewed in detail with the patient.  All questions were answered. Pt has home bp cuff. Check bp weekly, let u[place]oreaknow if >140/90.   Follow-up: Return for Twice weekly- HROB as scheduled.   Future Appointments  Date Time Provider [place] 10/15/2021 10:30 AM C[facility]-FTOBGYN NURSE C[facility]-FT [facility]TOBGYN  10/18/2021 10:15 AM [place] FTO[place]YN [place]-FTIMG None  10/18/2021 11:50 AM BRoma Schanz CNM C[facility]-FT FTOBGYN  10/22/2021 10:10 AM C[facility]-FTOBGYN NURSE C[facility]-FT FTOBGYN  10/25/2021  9:50 AM OJanyth Pupa DO C[facility]-FT FTOBGYN  10/29/2021 10:30 AM C[facility]-FTOBGYN NURSE C[facility]-FT FTOBGYN  11/01/2021 10:10 AM BRoma Schanz CNM C[facility]-FT FTOBGYN  11/05/2021  9:10 AM C[facility]-FTOBGYN NURSE C[facility]-FT FTOBGYN  11/08/2021  9:45 AM [place]- FTOBGYN [place]WH-FTIMG None  11/08/2021 10:50 AM OJanyth Pupa DO C[facility]-FT FTOBGYN  11/12/2021  9:50 AM CWH-FTOBGYN NURSE C[facility]-FT FTOBGYN  11/15/2021  9:30 AM C[facility]-FTOBGYN NURSE C[facility]-FT FTOBGYN  11/19/2021  9:30 AM C[facility]-FTOBGYN NURSE C[facility]-FT FTOBGYN  11/22/2021  9:50 AM Cresenzo-Dishmon, FJoaquim Lai CNM C[facility]-FT FTOBGYN    Orders Placed This Encounter  Procedures   POC Urinalysis Dipstick OB    JJanyth Pupa DO Attending OBrownsdale FOdellfor WDean Foods Company CWaverlyGroup

## 2021-10-15 ENCOUNTER — Ambulatory Visit (INDEPENDENT_AMBULATORY_CARE_PROVIDER_SITE_OTHER): Payer: Medicaid Other | Admitting: *Deleted

## 2021-10-15 ENCOUNTER — Other Ambulatory Visit: Payer: Self-pay

## 2021-10-15 VITALS — BP 120/81 | HR 100 | Wt 237.4 lb

## 2021-10-15 DIAGNOSIS — Z331 Pregnant state, incidental: Secondary | ICD-10-CM

## 2021-10-15 DIAGNOSIS — O24415 Gestational diabetes mellitus in pregnancy, controlled by oral hypoglycemic drugs: Secondary | ICD-10-CM

## 2021-10-15 DIAGNOSIS — O288 Other abnormal findings on antenatal screening of mother: Secondary | ICD-10-CM

## 2021-10-15 DIAGNOSIS — O0990 Supervision of high risk pregnancy, unspecified, unspecified trimester: Secondary | ICD-10-CM

## 2021-10-15 DIAGNOSIS — Z1389 Encounter for screening for other disorder: Secondary | ICD-10-CM

## 2021-10-15 LAB — POCT URINALYSIS DIPSTICK OB
Blood, UA: NEGATIVE
Glucose, UA: NEGATIVE
Ketones, UA: NEGATIVE
Leukocytes, UA: NEGATIVE
Nitrite, UA: NEGATIVE

## 2021-10-15 NOTE — Progress Notes (Cosign Needed)
° °  NURSE VISIT- NST  SUBJECTIVE:  Shannon Diaz is a 31 y.o. [phone] female at [redacted]w[redacted]d, here for a NST for pregnancy complicated by A2DM currently on metformin .  She reports active fetal movement, contractions: none, vaginal bleeding: none, membranes: intact.   OBJECTIVE:  BP 120/81    Pulse 100    Wt 237 lb 6.4 oz (107.7 kg)    LMP 03/26/2021 (Approximate)    BMI 44.86 kg/m   Appears well, no apparent distress  Results for orders placed or performed in visit on 10/15/21 (from the past 24 hour(s))  POC Urinalysis Dipstick OB   Collection Time: 10/15/21 10:45 AM  Result Value Ref Range   Color, UA     Clarity, UA     Glucose, UA Negative Negative   Bilirubin, UA     Ketones, UA neg    Spec Grav, UA     Blood, UA neg    pH, UA     POC,PROTEIN,UA Trace Negative, Trace, Small (1+), Moderate (2+), Large (3+), 4+   Urobilinogen, UA     Nitrite, UA neg    Leukocytes, UA Negative Negative   Appearance     Odor      NST: FHR baseline 145 bpm, Variability: moderate, Accelerations:present, Decelerations:  Absent= Cat 1/reactive Toco: none   ASSESSMENT: [id number] at [redacted]w[redacted]d with A2DM currently on Metformin NST reactive  PLAN: EFM strip reviewed by Dr. [name] Hidden   Recommendations: keep next appointment as scheduled    [name]  10/15/2021 12:40 PM

## 2021-10-17 ENCOUNTER — Other Ambulatory Visit: Payer: Self-pay | Admitting: Obstetrics & Gynecology

## 2021-10-17 DIAGNOSIS — O24419 Gestational diabetes mellitus in pregnancy, unspecified control: Secondary | ICD-10-CM

## 2021-10-18 ENCOUNTER — Ambulatory Visit (INDEPENDENT_AMBULATORY_CARE_PROVIDER_SITE_OTHER): Payer: Medicaid Other | Admitting: Women's Health

## 2021-10-18 ENCOUNTER — Ambulatory Visit (INDEPENDENT_AMBULATORY_CARE_PROVIDER_SITE_OTHER): Payer: Medicaid Other

## 2021-10-18 ENCOUNTER — Encounter: Payer: Self-pay | Admitting: Women's Health

## 2021-10-18 ENCOUNTER — Other Ambulatory Visit: Payer: Self-pay

## 2021-10-18 VITALS — BP 135/79 | HR 91 | Wt 239.0 lb

## 2021-10-18 DIAGNOSIS — O24415 Gestational diabetes mellitus in pregnancy, controlled by oral hypoglycemic drugs: Secondary | ICD-10-CM

## 2021-10-18 DIAGNOSIS — Z3A35 35 weeks gestation of pregnancy: Secondary | ICD-10-CM

## 2021-10-18 DIAGNOSIS — O24419 Gestational diabetes mellitus in pregnancy, unspecified control: Secondary | ICD-10-CM | POA: Diagnosis not present

## 2021-10-18 DIAGNOSIS — O0990 Supervision of high risk pregnancy, unspecified, unspecified trimester: Secondary | ICD-10-CM

## 2021-10-18 DIAGNOSIS — O0993 Supervision of high risk pregnancy, unspecified, third trimester: Secondary | ICD-10-CM

## 2021-10-18 LAB — POCT URINALYSIS DIPSTICK OB
Blood, UA: NEGATIVE
Glucose, UA: NEGATIVE
Ketones, UA: NEGATIVE
Leukocytes, UA: NEGATIVE
Nitrite, UA: NEGATIVE
POC,PROTEIN,UA: NEGATIVE

## 2021-10-18 MED ORDER — METFORMIN HCL 500 MG PO TABS: ORAL_TABLET | ORAL | 3 refills | Status: DC

## 2021-10-18 NOTE — Progress Notes (Signed)
HIGH-RISK PREGNANCY VISIT Patient name: Shannon Diaz MRN [id number]  Date of birth: [birth year] Chief Complaint:   Routine Prenatal Visit  History of Present Illness:   Shannon Diaz is a 31 y.o. [phone] female at [redacted]w[redacted]d with an Estimated Date of Delivery: 11/22/21 being seen today for ongoing management of a high-risk pregnancy complicated by diabetes mellitus A2DM currently on metformin 500mg  BID .    Today she reports  FBS 85-98(only 2 >95), 2hr pp 118-125 (majority >120) . Contractions: Irritability. Vag. Bleeding: None.  Movement: Present. denies leaking of fluid.   Depression screen [facility] 2/9 07/25/2021 05/30/2021 05/01/2016  Decreased Interest 0 0 0  Down, Depressed, Hopeless 0 0 0  PHQ - 2 Score 0 0 0  Altered sleeping 0 - -  Tired, decreased energy 0 - -  Change in appetite 0 - -  Feeling bad or failure about yourself  0 - -  Trouble concentrating 0 - -  Moving slowly or fidgety/restless 0 - -  Suicidal thoughts 0 - -  PHQ-9 Score 0 - -     GAD 7 : Generalized Anxiety Score 07/25/2021  Nervous, Anxious, on Edge 0  Control/stop worrying 0  Worry too much - different things 0  Trouble relaxing 0  Restless 0  Easily annoyed or irritable 0  Afraid - awful might happen 0  Total GAD 7 Score 0     Review of Systems:   Pertinent items are noted in HPI Denies abnormal vaginal discharge w/ itching/odor/irritation, headaches, visual changes, shortness of breath, chest pain, abdominal pain, severe nausea/vomiting, or problems with urination or bowel movements unless otherwise stated above. Pertinent History Reviewed:  Reviewed past medical,surgical, social, obstetrical and family history.  Reviewed problem list, medications and allergies. Physical Assessment:   Vitals:   10/18/21 1100  BP: 135/79  Pulse: 91  Weight: 239 lb (108.4 kg)  Body mass index is 45.16 kg/m.           Physical Examination:   General appearance: alert, well appearing, and in no  distress  Mental status: alert, oriented to person, place, and time  Skin: warm & dry   Extremities: Edema: Trace    Cardiovascular: normal heart rate noted  Respiratory: normal respiratory effort, no distress  Abdomen: gravid, soft, non-tender  Pelvic: Cervical exam deferred         Fetal Status: Fetal Heart Rate (bpm): 161 u/s   Movement: Present    Fetal Surveillance Testing today: 10/20/21 35 wks,cephalic,BPP 8/8,FHR 161 bpm,posterior/fundal placenta gr 3,AFI 14 cm,EFW 3113 g 93%,AC 99%,FL 1.2%  Chaperone: N/A    Results for orders placed or performed in visit on 10/18/21 (from the past 24 hour(s))  POC Urinalysis Dipstick OB   Collection Time: 10/18/21 11:03 AM  Result Value Ref Range   Color, UA     Clarity, UA     Glucose, UA Negative Negative   Bilirubin, UA     Ketones, UA neg    Spec Grav, UA     Blood, UA neg    pH, UA     POC,PROTEIN,UA Negative Negative, Trace, Small (1+), Moderate (2+), Large (3+), 4+   Urobilinogen, UA     Nitrite, UA neg    Leukocytes, UA Negative Negative   Appearance     Odor      Assessment & Plan:  High-risk pregnancy: 10/20/21 at [redacted]w[redacted]d with an Estimated Date of Delivery: 11/22/21   1) A2DM, increase metformin to 1000mg  in am,  continue 500mg  at night, new rx sent. EFW today 93%/3113g, extrapolates to 4100g @ 39wks  2) SVB then C/S, wants TOLAC, consent signed 2/9  Meds:  Meds ordered this encounter  Medications   metFORMIN (GLUCOPHAGE) 500 MG tablet    Sig: Take 2 tabs (1,000mg ) in the morning, and 500mg  (1 tab) at night    Dispense:  90 tablet    Refill:  3    Order Specific Question:   Supervising Provider    Answer:   4/9 [[id number]]    Labs/procedures today: U/S  Treatment Plan:   2x/wk testing Deliver @ 39wks Reviewed: Preterm labor symptoms and general obstetric precautions including but not limited to vaginal bleeding, contractions, leaking of fluid and fetal movement were reviewed in detail with the patient.  All  questions were answered. Does have home bp cuff. Office bp cuff given: not applicable. Check bp weekly, let know if consistently >140 and/or >90.  Follow-up: Return for As scheduled.   Future Appointments  Date Time Provider Department Center  10/22/2021 10:10 AM [facility]-FTOBGYN NURSE [facility]  10/25/2021  9:50 AM 10/24/2021, DO [facility]FT [facility]TO[facility]  10/29/2021 10:30 AM [facility]FT[facility]GYN NURSE [facility]  11/01/2021 10:10 AM 10/31/2021, CNM [facility]  11/05/2021  9:10 AM C[facility]-FTOBGYN NURSE [facility]  11/08/2021  9:45 AM C[facility] - FTOBGYN 01/05/2022 CWH-FTIMG None  11/08/2021 10:50 AM [place], DO [facility]-FT FTOBGYN  11/12/2021  9:50 AM [facility]-FTOBGYN NURSE [facility]  11/15/2021  9:30 AM [facility]-FTOBGYN NURSE [facility]  11/19/2021  9:30 AM C[facility]-FTOBGYN NURSE [facility]TO[facility]  11/22/2021  9:50 AM Cresenzo-Dishmon, 11/21/2021, CNM [facility]TOBG[facility]    Orders Placed This Encounter  Procedures   POC Urinalysis Dipstick OB   11/24/2021, CNM, [facility] 10/18/2021 11:53 AM

## 2021-10-18 NOTE — Progress Notes (Signed)
[place] 35 wks,cephalic,BPP 8/8,FHR 161 bpm,posterior/fundal placenta gr 3,AFI 14 cm,EFW 3113 g 93%,AC 99%,FL 1.2%

## 2021-10-18 NOTE — Patient Instructions (Signed)
Shannon Diaz, thank you for choosing our office today! We appreciate the opportunity to meet your healthcare needs. You may receive a short survey by mail, e-mail, or through [employer]. If you are happy with your care we would appreciate if you could take just a few minutes to complete the survey questions. We read all of your comments and take your feedback very seriously. Thank you again for choosing our office.  Center for [employer] Team at [facility]  [facility] at [facility] ([address] [place], [place] [address]) Entrance C, located off of [address] Free 24/7 valet parking   CLASSES: Go to [email or web address] to register for classes (childbirth, breastfeeding, waterbirth, infant CPR, daddy bootcamp, etc.)  Call the office [phone]) or go to [facility] if: You begin to have strong, frequent contractions Your water breaks.  Sometimes it is a big gush of fluid, sometimes it is just a trickle that keeps getting your panties wet or running down your legs You have vaginal bleeding.  It is normal to have a small amount of spotting if your cervix was checked.  You don't feel your baby moving like normal.  If you don't, get you something to eat and drink and lay down and focus on feeling your baby move.   If your baby is still not moving like normal, you should call the office or go to [facility].  Call the office [phone]) or go to [facility] hospital for these signs of pre-eclampsia: Severe headache that does not go away with Tylenol Visual changes- seeing spots, double, blurred vision Pain under your right breast or upper abdomen that does not go away with Tums or heartburn medicine Nausea and/or vomiting Severe swelling in your hands, feet, and face   Tdap Vaccine It is recommended that you get the Tdap vaccine during the third trimester of EACH pregnancy to help protect your baby from getting pertussis (whooping cough) 27-36 weeks is the BEST time to do  this so that you can pass the protection on to your baby. During pregnancy is better than after pregnancy, but if you are unable to get it during pregnancy it will be offered at the hospital.  You can get this vaccine with [place], at the health department, your family doctor, or some local pharmacies Everyone who will be around your baby should also be up-to-date on their vaccines before the baby comes. Adults (who are not pregnant) only need 1 dose of Tdap during adulthood.   [facility] Pediatricians/Family Doctors [facility]): [address] Colette Ribas, [phone]           [facility]: [address], [phone]                [facility]): [address], [phone] (call to ask if accepting patients) [facility] Department: [address], [place], [phone]    [facility] Pediatricians/Family Doctors [facility]): [phone] [address], [phone] [facility]: [address] [place], [phone] [facility]: [address], [phone]  [facility] Doctors  [facility]): [phone] [facility]: [address] [phone]   [facility] Doctors [facility]: [address] [phone]  [facility] Family Doctors  [facility]: [phone], [phone]  Home Blood Pressure Monitoring for Patients   Your provider has recommended that you check your  blood pressure (BP) at least once a week at home. If you do not have a blood pressure cuff at home, one will be provided for you. Contact your provider if you have not received your monitor within 1 week.  ° °Helpful Tips for Accurate Home Blood Pressure Checks  °Don't smoke, exercise, or drink caffeine 30 minutes before checking your BP °Use the restroom before checking your BP (a full bladder can raise your  pressure) °Relax in a comfortable upright chair °Feet on the ground °Left arm resting comfortably on a flat surface at the level of your heart °Legs uncrossed °Back supported °Sit quietly and don't talk °Place the cuff on your bare arm °Adjust snuggly, so that only two fingertips can fit between your skin and the top of the cuff °Check 2 readings separated by at least one minute °Keep a log of your BP readings °For a visual, please reference this diagram: http://ccnc.care/bpdiagram ° °Provider Name: [facility]     Phone: [phone] ° °Zone 1: ALL CLEAR  °Continue to monitor your symptoms:  °BP reading is less than 140 (top number) or less than 90 (bottom number)  °No right upper stomach pain °No headaches or seeing spots °No feeling nauseated or throwing up °No swelling in face and hands ° °Zone 2: CAUTION °Call your doctor's office for any of the following:  °BP reading is greater than 140 (top number) or greater than 90 (bottom number)  °Stomach pain under your ribs in the middle or right side °Headaches or seeing spots °Feeling nauseated or throwing up °Swelling in face and hands ° °Zone 3: EMERGENCY  °Seek immediate medical care if you have any of the following:  °BP reading is greater than160 (top number) or greater than 110 (bottom number) °Severe headaches not improving with Tylenol °Serious difficulty catching your breath °Any worsening symptoms from Zone 2  °Preterm Labor and Birth Information ° °The normal length of a pregnancy is 39-41 weeks. Preterm labor is when labor starts before 37 completed weeks of pregnancy. °What are the risk factors for preterm labor? °Preterm labor is more likely to occur in women who: °Have certain infections during pregnancy such as a bladder infection, sexually transmitted infection, or infection inside the uterus (chorioamnionitis). °Have a shorter-than-normal cervix. °Have gone into preterm labor before. °Have had surgery on their cervix. °Are younger than age 17  or older than age 35. °Are African American. °Are pregnant with twins or multiple babies (multiple gestation). °Take street drugs or smoke while pregnant. °Do not gain enough weight while pregnant. °Became pregnant shortly after having been pregnant. °What are the symptoms of preterm labor? °Symptoms of preterm labor include: °Cramps similar to those that can happen during a menstrual period. The cramps may happen with diarrhea. °Pain in the abdomen or lower back. °Regular uterine contractions that may feel like tightening of the abdomen. °A feeling of increased pressure in the pelvis. °Increased watery or bloody mucus discharge from the vagina. °Water breaking (ruptured amniotic sac). °Why is it important to recognize signs of preterm labor? °It is important to recognize signs of preterm labor because babies who are born prematurely may not be fully developed. This can put them at an increased risk for: °Long-term (chronic) heart and lung problems. °Difficulty immediately after birth with regulating body systems, including blood sugar, body temperature, heart rate, and breathing rate. °Bleeding in the brain. °Cerebral palsy. °Learning difficulties. °Death. °These risks are highest for babies who are born before 34 weeks   of pregnancy. How is preterm labor treated? Treatment depends on the length of your pregnancy, your condition, and the health of your baby. It may involve: Having a stitch (suture) placed in your cervix to prevent your cervix from opening too early (cerclage). Taking or being given medicines, such as: Hormone medicines. These may be given early in pregnancy to help support the pregnancy. Medicine to stop contractions. Medicines to help mature the babys lungs. These may be prescribed if the risk of delivery is high. Medicines to prevent your baby from developing cerebral palsy. If the labor happens before 34 weeks of pregnancy, you may need to stay in the hospital. What should I do if I  think I am in preterm labor? If you think that you are going into preterm labor, call your health care provider right away. How can I prevent preterm labor in future pregnancies? To increase your chance of having a full-term pregnancy: Do not use any tobacco products, such as cigarettes, chewing tobacco, and e-cigarettes. If you need help quitting, ask your health care provider. Do not use street drugs or medicines that have not been prescribed to you during your pregnancy. Talk with your health care provider before taking any herbal supplements, even if you have been taking them regularly. Make sure you gain a healthy amount of weight during your pregnancy. Watch for infection. If you think that you might have an infection, get it checked right away. Make sure to tell your health care provider if you have gone into preterm labor before. This information is not intended to replace advice given to you by your health care provider. Make sure you discuss any questions you have with your health care provider. Document Revised: 12/11/2018 Document Reviewed: 01/10/2016 Elsevier Patient Education  [place].

## 2021-10-22 ENCOUNTER — Other Ambulatory Visit: Payer: Self-pay

## 2021-10-22 ENCOUNTER — Ambulatory Visit (INDEPENDENT_AMBULATORY_CARE_PROVIDER_SITE_OTHER): Payer: Medicaid Other | Admitting: *Deleted

## 2021-10-22 VITALS — BP 123/87 | HR 106 | Wt 240.4 lb

## 2021-10-22 DIAGNOSIS — O288 Other abnormal findings on antenatal screening of mother: Secondary | ICD-10-CM

## 2021-10-22 DIAGNOSIS — O0990 Supervision of high risk pregnancy, unspecified, unspecified trimester: Secondary | ICD-10-CM

## 2021-10-22 DIAGNOSIS — Z331 Pregnant state, incidental: Secondary | ICD-10-CM

## 2021-10-22 DIAGNOSIS — O24415 Gestational diabetes mellitus in pregnancy, controlled by oral hypoglycemic drugs: Secondary | ICD-10-CM

## 2021-10-22 DIAGNOSIS — Z1389 Encounter for screening for other disorder: Secondary | ICD-10-CM

## 2021-10-22 LAB — POCT URINALYSIS DIPSTICK OB
Blood, UA: NEGATIVE
Glucose, UA: NEGATIVE
Ketones, UA: NEGATIVE
Leukocytes, UA: NEGATIVE
Nitrite, UA: NEGATIVE

## 2021-10-22 NOTE — Progress Notes (Signed)
° °  NURSE VISIT- NST  SUBJECTIVE:  Shannon Diaz is a 31 y.o. 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  She reports active fetal movement, contractions: none, vaginal bleeding: none, membranes: intact.   OBJECTIVE:  BP 123/87    Pulse (!) 106    Wt 240 lb 6.4 oz (109 kg)    LMP 03/26/2021 (Approximate)    BMI 45.42 kg/m   Appears well, no apparent distress  Results for orders placed or performed in visit on 10/22/21 (from the past 24 hour(s))  POC Urinalysis Dipstick OB   Collection Time: 10/22/21 10:37 AM  Result Value Ref Range   Color, UA     Clarity, UA     Glucose, UA Negative Negative   Bilirubin, UA     Ketones, UA neg    Spec Grav, UA     Blood, UA neg    pH, UA     POC,PROTEIN,UA Trace Negative, Trace, Small (1+), Moderate (2+), Large (3+), 4+   Urobilinogen, UA     Nitrite, UA neg    Leukocytes, UA Negative Negative   Appearance     Odor      NST: FHR baseline 145 bpm, Variability: moderate, Accelerations:present, Decelerations:  Absent= Cat 1/reactive Toco: none   ASSESSMENT: [id number] at [redacted]w[redacted]d with A2DM currently on Metformin NST reactive  PLAN: EFM strip reviewed by Dr. Charlotta Newton   Recommendations: keep next appointment as scheduled    Shannon Diaz  10/22/2021 12:11 PM

## 2021-10-25 ENCOUNTER — Other Ambulatory Visit: Payer: Self-pay

## 2021-10-25 ENCOUNTER — Ambulatory Visit (INDEPENDENT_AMBULATORY_CARE_PROVIDER_SITE_OTHER): Payer: Medicaid Other | Admitting: Obstetrics & Gynecology

## 2021-10-25 ENCOUNTER — Other Ambulatory Visit (HOSPITAL_COMMUNITY)
Admission: RE | Admit: 2021-10-25 | Discharge: 2021-10-25 | Disposition: A | Discharge status: Home / Self Care | Payer: Medicaid Other | Source: Ambulatory Visit | Attending: Obstetrics & Gynecology | Admitting: Obstetrics & Gynecology

## 2021-10-25 ENCOUNTER — Encounter: Payer: Self-pay | Admitting: Obstetrics & Gynecology

## 2021-10-25 VITALS — BP 131/85 | HR 97 | Wt 241.4 lb

## 2021-10-25 DIAGNOSIS — O24415 Gestational diabetes mellitus in pregnancy, controlled by oral hypoglycemic drugs: Secondary | ICD-10-CM

## 2021-10-25 DIAGNOSIS — Z331 Pregnant state, incidental: Secondary | ICD-10-CM

## 2021-10-25 DIAGNOSIS — Z1389 Encounter for screening for other disorder: Secondary | ICD-10-CM

## 2021-10-25 DIAGNOSIS — Z3A36 36 weeks gestation of pregnancy: Secondary | ICD-10-CM | POA: Diagnosis present

## 2021-10-25 DIAGNOSIS — O0993 Supervision of high risk pregnancy, unspecified, third trimester: Secondary | ICD-10-CM

## 2021-10-25 LAB — POCT URINALYSIS DIPSTICK OB
Blood, UA: NEGATIVE
Glucose, UA: NEGATIVE
Ketones, UA: NEGATIVE
Leukocytes, UA: NEGATIVE
Nitrite, UA: NEGATIVE

## 2021-10-25 LAB — OB RESULTS CONSOLE GC/CHLAMYDIA: Gonorrhea: NEGATIVE

## 2021-10-25 LAB — OB RESULTS CONSOLE GBS: GBS: NEGATIVE

## 2021-10-25 NOTE — Progress Notes (Addendum)
HIGH-RISK PREGNANCY VISIT Patient name: Shannon Diaz MRN [id number]  Date of birth: July 27, 1991 Chief Complaint:   Routine Prenatal Visit (culture)  History of Present Illness:   Shannon Diaz is a 31 y.o. 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    Today she reports lower pelvic pressure.  Contractions: Not present. Denies vaginal bleeding .  Movement: Present. denies leaking of fluid.   [facility] 07/25/2021 05/30/2021 05/01/2016  Decreased Interest 0 0 0  Down, Depressed, Hopeless 0 0 0  PHQ - 2 Score 0 0 0  Altered sleeping 0 - -  Tired, decreased energy 0 - -  Change in appetite 0 - -  Feeling bad or failure about yourself  0 - -  Trouble concentrating 0 - -  Moving slowly or fidgety/restless 0 - -  Suicidal thoughts 0 - -  PHQ-9 Score 0 - -     Current Outpatient Medications  Medication Instructions   Accu-Chek Softclix Lancets lancets Check blood sugar 4 times a day.   aspirin EC 81 mg, Oral, Daily, Take after 12 weeks for prevention of preeclampsia later in pregnancy   blood glucose meter kit and supplies Dispense based on patient and insurance preference. Use four times daily as directed to check blood sugar (FOR ICD-10 E10.9, E11.9).   glucose blood (ACCU-CHEK GUIDE) test strip Check blood sugar four times daily   metFORMIN (GLUCOPHAGE) 500 MG tablet Take 2 tabs (1,000m68mn the morning, and 500mg60mtab) at night   prenatal vitamin w/FE, FA (PRENATAL 1 + 1) 27-1 MG TABS tablet 1 tablet, Oral, Daily     Review of Systems:   Pertinent items are noted in HPI Denies abnormal vaginal discharge w/ itching/odor/irritation, headaches, visual changes, shortness of breath, chest pain, abdominal pain, severe nausea/vomiting, or problems with urination or bowel  movements unless otherwise stated above. Pertinent History Reviewed:  Reviewed past medical,surgical, social, obstetrical and family history.  Reviewed problem list, medications and allergies. Physical Assessment:   Vitals:   10/25/21 0958 10/25/21 1006  BP: (!) 144/86 131/85  Pulse: (!) 116 97  Weight: 241 lb 6.4 oz (109.5 kg)   Body mass index is 45.61 kg/m.           Physical Examination:   General appearance: alert, well appearing, and in no distress  Mental status: normal mood, behavior, speech, dress, motor activity, and thought processes  Skin: warm & dry   Extremities: Edema: Trace    Cardiovascular: normal heart rate noted  Respiratory: normal respiratory effort, no distress  Abdomen: gravid, soft, non-tender  Pelvic: Cervical exam deferred         Fetal Status: Fetal Heart Rate (bpm): 150 Fundal Height: 36 cm Movement: Present    Fetal Surveillance Testing today: NST  NST being performed due to GMDA2  Fetal Monitoring:  Baseline: 150 bpm, Variability: moderate, Accelerations: present, The accelerations are >15 bpm and more than 2 in 20 minutes, and Decelerations: Absent    Final diagnosis:  Reactive NST   Chaperone: AmandCelene Squibbesults for orders placed or performed in visit on 10/25/21 (from the past 24 hour(s))  POC Urinalysis Dipstick OB   Collection Time: 10/25/21 10:19 AM  Result Value Ref Range   Color, UA     Clarity, UA  Glucose, UA Negative Negative   Bilirubin, UA     Ketones, UA neg    Spec Grav, UA     Blood, UA neg    pH, UA     POC,PROTEIN,UA Trace Negative, Trace, Small (1+), Moderate (2+), Large (3+), 4+   Urobilinogen, UA     Nitrite, UA neg    Leukocytes, UA Negative Negative   Appearance     Odor       Assessment & Plan:  High-risk pregnancy: [id number] at 33w0dwith an Estimated Date of Delivery: 11/22/21   1) GMDA2- continue with metformin -twice weekly antepartum testing -may consider earlier IOL 38-39wks if suspected  LGA  2) Obesity  -desires TOLAC  Meds: No orders of the defined types were placed in this encounter.   Labs/procedures today: NST, GBS/ GC/C collected  Treatment Plan:  continue as outlined above  Reviewed: Preterm labor symptoms and general obstetric precautions including but not limited to vaginal bleeding, contractions, leaking of fluid and fetal movement were reviewed in detail with the patient.  All questions were answered. Pt has home bp cuff. Check bp weekly, let u[place]ore[place]now if >140/90.   Follow-up: Return in about 1 week (around 11/01/2021) for HROB visit (twice weekly as scheduled).   Future Appointments  Date Time Provider [place] 10/29/2021 10:30 AM [facility] NURSE [facility]  11/01/2021 10:10 AM BRoma Schanz CNM [facility]  11/05/2021  9:10 AM [facility] NURSE [facility]  11/08/2021  9:45 AM [place] FTO[facility]YN [place] None  11/08/2021 10:50 AM OJanyth Pupa DO [facility]  11/12/2021  9:50 AM [facility] NURSE [facility]  11/15/2021  9:30 AM [facility] NURSE [facility]  11/19/2021  9:30 AM [facility] NURSE [facility]  11/22/2021  9:50 AM Cresen[place]-Dish[place], FJoaquim Lai CNM [facility]    Orders Placed This Encounter  Procedures   Strep Gp B NAA+Rflx   POC Urinalysis Dipstick OB    JJanyth Pupa DO Attending OHudspeth FOxon Hillfor WDe[employer] Company [place]

## 2021-10-26 LAB — CERVICOVAGINAL ANCILLARY ONLY
Chlamydia: NEGATIVE
Comment: NEGATIVE
Comment: NORMAL
Neisseria Gonorrhea: NEGATIVE

## 2021-10-27 LAB — STREP GP B NAA+RFLX: Strep Gp B NAA+Rflx: NEGATIVE

## 2021-10-29 ENCOUNTER — Ambulatory Visit (INDEPENDENT_AMBULATORY_CARE_PROVIDER_SITE_OTHER): Payer: Medicaid Other

## 2021-10-29 ENCOUNTER — Other Ambulatory Visit: Payer: Self-pay

## 2021-10-29 VITALS — BP 128/82 | HR 94 | Wt 240.2 lb

## 2021-10-29 DIAGNOSIS — O0993 Supervision of high risk pregnancy, unspecified, third trimester: Secondary | ICD-10-CM

## 2021-10-29 DIAGNOSIS — Z3A36 36 weeks gestation of pregnancy: Secondary | ICD-10-CM | POA: Diagnosis not present

## 2021-10-29 DIAGNOSIS — O0990 Supervision of high risk pregnancy, unspecified, unspecified trimester: Secondary | ICD-10-CM

## 2021-10-29 LAB — POCT URINALYSIS DIPSTICK OB
Blood, UA: NEGATIVE
Glucose, UA: NEGATIVE
Ketones, UA: NEGATIVE
Leukocytes, UA: NEGATIVE
Nitrite, UA: NEGATIVE
POC,PROTEIN,UA: NEGATIVE

## 2021-10-29 NOTE — Progress Notes (Signed)
° °  NURSE VISIT- NST  SUBJECTIVE:  Shannon Diaz is a 31 y.o. [phone] female at [redacted]w[redacted]d, here for a NST for pregnancy complicated by [id number] currently on Metformin 500 mg BID .  She reports active fetal movement, contractions: none, vaginal bleeding: none, membranes: intact.   OBJECTIVE:  BP 128/82    Pulse 94    Wt 240 lb 3.2 oz (109 kg)    LMP 03/26/2021 (Approximate)    BMI 45.39 kg/m   Appears well, no apparent distress  Results for orders placed or performed in visit on 10/29/21 (from the past 24 hour(s))  POC Urinalysis Dipstick OB   Collection Time: 10/29/21 10:49 AM  Result Value Ref Range   Color, UA     Clarity, UA     Glucose, UA Negative Negative   Bilirubin, UA     Ketones, UA neg    Spec Grav, UA     Blood, UA neg    pH, UA     POC,PROTEIN,UA Negative Negative, Trace, Small (1+), Moderate (2+), Large (3+), 4+   Urobilinogen, UA     Nitrite, UA neg    Leukocytes, UA Negative Negative   Appearance     Odor      NST: FHR baseline 150 bpm, Variability: moderate, Accelerations:present, Decelerations:  Absent= Cat 1/reactive Toco: none   ASSESSMENT: [id number] at [redacted]w[redacted]d with A2DM currently on Metformin 500 mg BID NST reactive  PLAN: EFM strip interpreted and reviewed by Knute Neu, CNM, [facility]   Recommendations: keep next appointment as scheduled    Jonnelle Lawniczak A Coraima Tibbs  10/29/2021 12:00 PM   Chart reviewed for nurse visit. Agree with plan of care.  Roma Schanz, [place] 10/30/2021 8:50 AM

## 2021-11-01 ENCOUNTER — Other Ambulatory Visit: Payer: Self-pay

## 2021-11-01 ENCOUNTER — Encounter: Payer: Self-pay | Admitting: Women's Health

## 2021-11-01 ENCOUNTER — Ambulatory Visit (INDEPENDENT_AMBULATORY_CARE_PROVIDER_SITE_OTHER): Payer: Medicaid Other | Admitting: Women's Health

## 2021-11-01 VITALS — BP 145/93 | HR 109 | Wt 240.6 lb

## 2021-11-01 DIAGNOSIS — O24415 Gestational diabetes mellitus in pregnancy, controlled by oral hypoglycemic drugs: Secondary | ICD-10-CM

## 2021-11-01 DIAGNOSIS — O0990 Supervision of high risk pregnancy, unspecified, unspecified trimester: Secondary | ICD-10-CM

## 2021-11-01 DIAGNOSIS — Z331 Pregnant state, incidental: Secondary | ICD-10-CM

## 2021-11-01 DIAGNOSIS — O0993 Supervision of high risk pregnancy, unspecified, third trimester: Secondary | ICD-10-CM

## 2021-11-01 DIAGNOSIS — Z1389 Encounter for screening for other disorder: Secondary | ICD-10-CM

## 2021-11-01 LAB — POCT URINALYSIS DIPSTICK OB
Blood, UA: NEGATIVE
Glucose, UA: NEGATIVE
Ketones, UA: NEGATIVE
Leukocytes, UA: NEGATIVE
Nitrite, UA: NEGATIVE

## 2021-11-01 NOTE — Progress Notes (Signed)
? ?HIGH-RISK PREGNANCY VISIT ?Patient name: Shannon Diaz MRN [id number]  Date of birth: June 25, 1991 ?Chief Complaint:   ?Routine Prenatal Visit and Non-stress Test ? ?History of Present Illness:   ?Shannon Diaz is a 31 y.o. [phone] female at 100w0d with an Estimated Date of Delivery: 11/22/21 being seen today for ongoing management of a high-risk pregnancy complicated by diabetes mellitus A2DM currently on metformin 1000AM/500PM .   ? ?Today she reports  all sugars wnl . Home bp's 120s/80s. Denies severe ha, visual changes, ruq/epigastric pain, n/v.   Contractions: Irritability.  .  Movement: Present. denies leaking of fluid.  ? ?Depression screen [facility] 2/9 07/25/2021 05/30/2021 05/01/2016  ?Decreased Interest 0 0 0  ?Down, Depressed, Hopeless 0 0 0  ?PHQ - 2 Score 0 0 0  ?Altered sleeping 0 - -  ?Tired, decreased energy 0 - -  ?Change in appetite 0 - -  ?Feeling bad or failure about yourself  0 - -  ?Trouble concentrating 0 - -  ?Moving slowly or fidgety/restless 0 - -  ?Suicidal thoughts 0 - -  ?PHQ-9 Score 0 - -  ? ?  ?GAD 7 : Generalized Anxiety Score 07/25/2021  ?Nervous, Anxious, on Edge 0  ?Control/stop worrying 0  ?Worry too much - different things 0  ?Trouble relaxing 0  ?Restless 0  ?Easily annoyed or irritable 0  ?Afraid - awful might happen 0  ?Total GAD 7 Score 0  ? ? ? ?Review of Systems:   ?Pertinent items are noted in HPI ?Denies abnormal vaginal discharge w/ itching/odor/irritation, headaches, visual changes, shortness of breath, chest pain, abdominal pain, severe nausea/vomiting, or problems with urination or bowel movements unless otherwise stated above. ?Pertinent History Reviewed:  ?Reviewed past medical,surgical, social, obstetrical and family history.  ?Reviewed problem list, medications and allergies. ?Physical Assessment:  ? ?Vitals:  ? 11/01/21 1020  ?BP: (!) 145/93  ?Pulse: (!) 109  ?Weight: 240 lb 9.6 oz (109.1 kg)  ?Body mass index is 45.46 kg/m?. ?     ?     Physical  Examination:  ? General appearance: alert, well appearing, and in no distress ? Mental status: alert, oriented to person, place, and time ? Skin: warm & dry  ? Extremities: Edema: Trace  ?  Cardiovascular: normal heart rate noted ? Respiratory: normal respiratory effort, no distress ? Abdomen: gravid, soft, non-tender ? Pelvic: Cervical exam deferred        ? ?Fetal Status: Fetal Heart Rate (bpm): 150   Movement: Present   ? ?Fetal Surveillance Testing today: NST: FHR baseline 150 bpm, Variability: moderate, Accelerations:present, Decelerations:  Absent= Cat 1/reactive ?Toco: none   ? ?Chaperone: N/A   ? ?Results for orders placed or performed in visit on 11/01/21 (from the past 24 hour(s))  ?POC Urinalysis Dipstick OB  ? Collection Time: 11/01/21 10:28 AM  ?Result Value Ref Range  ? Color, UA    ? Clarity, UA    ? Glucose, UA Negative Negative  ? Bilirubin, UA    ? Ketones, UA neg   ? Spec Grav, UA    ? Blood, UA neg   ? pH, UA    ? POC,PROTEIN,UA Trace Negative, Trace, Small (1+), Moderate (2+), Large (3+), 4+  ? Urobilinogen, UA    ? Nitrite, UA neg   ? Leukocytes, UA Negative Negative  ? Appearance    ? Odor    ?  ?Assessment & Plan:  ?High-risk pregnancy: [id number] at [redacted]w[redacted]d with an Estimated Date of  Delivery: 11/22/21  ? ?1) A2DM, stable on metformin 1000AM/500PM, EFW 93% at 35wks, extraps to 4100g @ 39wk ? ?2) Elevated bp, normal home bp this am, asymptomatic, tr protein but has had this intermittently. To return in am for nurse bp check. Reviewed pre-e s/s, reasons to seek care ? ?3) SVB then C/S for abruption> wants TOLAC, consent 2/9 ? ?Meds: No orders of the defined types were placed in this encounter. ? ? ?Labs/procedures today: NST ? ?Treatment Plan:  bp check am ? ?Reviewed: Preterm labor symptoms and general obstetric precautions including but not limited to vaginal bleeding, contractions, leaking of fluid and fetal movement were reviewed in detail with the patient.  All questions were answered. Does have  home bp cuff. Office bp cuff given: not applicable. Check bp twice daily, let us know if consistently >140 and/or >90. ? ?Follow-up: Return for am for bp check w/ nurse. ? ? ?Future Appointments  ?Date Time Provider [place]  ?11/05/2021  9:10 AM [facility] NURSE [facility]  ?11/08/2021  9:50 AM [name], MD [facility]  ?11/12/2021  3:00 PM [facility] - FTOBGYN [place] [facility]G None  ?11/12/2021  3:50 PM [name], MD [facility]  ?11/15/2021  9:30 AM [facility] NURSE [facility]  ?11/19/2021  9:30 AM [facility] NURSE [facility]  ?11/22/2021  9:50 AM Cresenzo-Dishmon, Joaquim Lai, CNM [facility]  ? ? ?Orders Placed This Encounter  ?Procedures  ? POC Urinalysis Dipstick OB  ? ?[place], [place] ?11/01/2021 ?10:55 AM  ?

## 2021-11-01 NOTE — Patient Instructions (Signed)
Tishina, thank you for choosing our office today! We appreciate the opportunity to meet your healthcare needs. You may receive a short survey by mail, e-mail, or through [employer]. If you are happy with your care we would appreciate if you could take just a few minutes to complete the survey questions. We read all of your comments and take your feedback very seriously. Thank you again for choosing our office.  Center for [employer] Team at [facility]  [facility] at [facility] ([address] [place], [place] [address]) Entrance C, located off of [address] Free 24/7 valet parking   CLASSES: Go to [email or web address] to register for classes (childbirth, breastfeeding, waterbirth, infant CPR, daddy bootcamp, etc.)  Call the office [phone]) or go to [facility] if: You begin to have strong, frequent contractions Your water breaks.  Sometimes it is a big gush of fluid, sometimes it is just a trickle that keeps getting your panties wet or running down your legs You have vaginal bleeding.  It is normal to have a small amount of spotting if your cervix was checked.  You don't feel your baby moving like normal.  If you don't, get you something to eat and drink and lay down and focus on feeling your baby move.   If your baby is still not moving like normal, you should call the office or go to [facility].  Call the office [phone]) or go to [facility] hospital for these signs of pre-eclampsia: Severe headache that does not go away with Tylenol Visual changes- seeing spots, double, blurred vision Pain under your right breast or upper abdomen that does not go away with Tums or heartburn medicine Nausea and/or vomiting Severe swelling in your hands, feet, and face   [facility] Pediatricians/Family Doctors [facility]): [address] Colette Ribas, [phone]           [facility]: [address], [phone]                 [facility]): [address], [phone] (call to ask if accepting patients) [facility] Department: [address], [place], [phone]    [facility] Pediatricians/Family Doctors [facility]): [address], [phone] [facility]: [address] [place], [phone] [facility]: [address], [phone]  [facility] Doctors  [facility]): [phone] [facility]: [address] [phone]   [facility] Doctors [facility]: [address], [phone]  [facility] Doctors  [facility]: [phone], [phone]  Home Blood Pressure Monitoring for Patients   Your provider has recommended that you check your blood pressure (BP) at least once a week at home. If you do not have a blood pressure cuff at home, one will be provided for you. Contact your provider if you have not received your monitor within 1 week.   Helpful Tips for Accurate Home Blood Pressure Checks  Don't smoke, exercise, or drink caffeine 30 minutes before checking your BP Use the restroom before checking your BP (a full bladder can raise your pressure) Relax in a comfortable upright chair Feet on the ground Left arm resting comfortably on a flat surface at the level of your heart Legs uncrossed Back supported Sit quietly and don't talk Place the cuff on your bare arm Adjust snuggly, so that only two fingertips  can fit between your skin and the top of the cuff Check 2 readings separated by at least one minute Keep a log of your BP readings For a visual, please reference this diagram: http://ccnc.care/bpdiagram  Provider Name: [facility]     Phone: [phone]  Zone 1: ALL CLEAR  Continue to monitor your symptoms:  BP reading is less than 140 (top number) or less than 90 (bottom number)  No right  upper stomach pain No headaches or seeing spots No feeling nauseated or throwing up No swelling in face and hands  Zone 2: CAUTION Call your doctor's office for any of the following:  BP reading is greater than 140 (top number) or greater than 90 (bottom number)  Stomach pain under your ribs in the middle or right side Headaches or seeing spots Feeling nauseated or throwing up Swelling in face and hands  Zone 3: EMERGENCY  Seek immediate medical care if you have any of the following:  BP reading is greater than160 (top number) or greater than 110 (bottom number) Severe headaches not improving with Tylenol Serious difficulty catching your breath Any worsening symptoms from Zone 2   Braxton Hicks Contractions Contractions of the uterus can occur throughout pregnancy, but they are not always a sign that you are in labor. You may have practice contractions called Braxton Hicks contractions. These false labor contractions are sometimes confused with true labor. What are [place] contractions? Braxton Hicks contractions are tightening movements that occur in the muscles of the uterus before labor. Unlike true labor contractions, these contractions do not result in opening (dilation) and thinning of the cervix. Toward the end of pregnancy (32-34 weeks), Braxton Hicks contractions can happen more often and may become stronger. These contractions are sometimes difficult to tell apart from true labor because they can be very uncomfortable. You should not feel embarrassed if you go to the hospital with false labor. Sometimes, the only way to tell if you are in true labor is for your health care provider to look for changes in the cervix. The health care provider will do a physical exam and may monitor your contractions. If you are not in true labor, the exam should show that your cervix is not dilating and your water has not broken. If there are no other health problems associated with your  pregnancy, it is completely safe for you to be sent home with false labor. You may continue to have Braxton Hicks contractions until you go into true labor. How to tell the difference between true labor and false labor True labor Contractions last 30-70 seconds. Contractions become very regular. Discomfort is usually felt in the top of the uterus, and it spreads to the lower abdomen and low back. Contractions do not go away with walking. Contractions usually become more intense and increase in frequency. The cervix dilates and gets thinner. False labor Contractions are usually shorter and not as strong as true labor contractions. Contractions are usually irregular. Contractions are often felt in the front of the lower abdomen and in the groin. Contractions may go away when you walk around or change positions while lying down. Contractions get weaker and are shorter-lasting as time goes on. The cervix usually does not dilate or become thin. Follow these instructions at home:  Take over-the-counter and prescription medicines only as told by your health care provider. Keep up with your usual exercises and follow other instructions from your health care provider. Eat and drink lightly if you think  you are going into labor. If Braxton Hicks contractions are making you uncomfortable: Change your position from lying down or resting to walking, or change from walking to resting. Sit and rest in a tub of warm water. Drink enough fluid to keep your urine pale yellow. Dehydration may cause these contractions. Do slow and deep breathing several times an hour. Keep all follow-up prenatal visits as told by your health care provider. This is important. Contact a health care provider if: You have a fever. You have continuous pain in your abdomen. Get help right away if: Your contractions become stronger, more regular, and closer together. You have fluid leaking or gushing from your vagina. You pass  blood-tinged mucus (bloody show). You have bleeding from your vagina. You have low back pain that you never had before. You feel your babys head pushing down and causing pelvic pressure. Your baby is not moving inside you as much as it used to. Summary Contractions that occur before labor are called Braxton Hicks contractions, false labor, or practice contractions. Braxton Hicks contractions are usually shorter, weaker, farther apart, and less regular than true labor contractions. True labor contractions usually become progressively stronger and regular, and they become more frequent. Manage discomfort from [facility] contractions by changing position, resting in a warm bath, drinking plenty of water, or practicing deep breathing. This information is not intended to replace advice given to you by your health care provider. Make sure you discuss any questions you have with your health care provider. Document Revised: 08/01/2017 Document Reviewed: 01/02/2017 Elsevier Patient Education  [place].

## 2021-11-02 ENCOUNTER — Encounter: Payer: Self-pay | Admitting: *Deleted

## 2021-11-02 ENCOUNTER — Ambulatory Visit (INDEPENDENT_AMBULATORY_CARE_PROVIDER_SITE_OTHER): Payer: Medicaid Other | Admitting: *Deleted

## 2021-11-02 VITALS — BP 131/86 | HR 103 | Wt 241.0 lb

## 2021-11-02 DIAGNOSIS — O0990 Supervision of high risk pregnancy, unspecified, unspecified trimester: Secondary | ICD-10-CM

## 2021-11-02 DIAGNOSIS — Z3A37 37 weeks gestation of pregnancy: Secondary | ICD-10-CM

## 2021-11-02 DIAGNOSIS — Z0130 Encounter for examination of blood pressure without abnormal findings: Secondary | ICD-10-CM

## 2021-11-02 LAB — POCT URINALYSIS DIPSTICK OB
Glucose, UA: NEGATIVE
Ketones, UA: NEGATIVE
Nitrite, UA: NEGATIVE
POC,PROTEIN,UA: NEGATIVE

## 2021-11-02 NOTE — Progress Notes (Signed)
? ?  NURSE VISIT- BLOOD PRESSURE CHECK ? ?SUBJECTIVE:  ?Shannon Diaz is a 31 y.o. [phone] female here for BP check. She is [redacted]w[redacted]d pregnant   ? ?HYPERTENSION ROS: ? ?Pregnant/postpartum:  ?Severe headaches that don't go away with tylenol/other medicines: Yes  ?Visual changes (seeing spots/double/blurred vision) No  ?Severe pain under right breast breast or in center of upper chest No  ?Severe nausea/vomiting Yes  ?Taking medicines as instructed not applicable ? ? ?OBJECTIVE:  ?BP 131/86   Pulse (!) 103   Wt 241 lb (109.3 kg)   LMP 03/26/2021 (Approximate)   BMI 45.54 kg/m?   ?Appearance alert, well appearing, and in no distress. ? ?ASSESSMENT: ?Pregnancy [redacted]w[redacted]d  blood pressure check ? ?PLAN:  ?Discussed with Joellyn Haff, CNM, WHNP   ?Recommendations: no changes needed. Check BP BID.   ?Follow-up: as scheduled. ?Malachy Mood  ?11/02/2021 ?10:43 AM ? ?

## 2021-11-04 ENCOUNTER — Other Ambulatory Visit: Payer: Self-pay

## 2021-11-04 ENCOUNTER — Inpatient Hospital Stay (HOSPITAL_COMMUNITY)
Admission: AD | Admit: 2021-11-04 | Discharge: 2021-11-05 | Disposition: A | Discharge status: Home / Self Care | Payer: Medicaid Other | Attending: Obstetrics and Gynecology | Admitting: Obstetrics and Gynecology

## 2021-11-04 DIAGNOSIS — O0990 Supervision of high risk pregnancy, unspecified, unspecified trimester: Secondary | ICD-10-CM

## 2021-11-04 DIAGNOSIS — O368130 Decreased fetal movements, third trimester, not applicable or unspecified: Secondary | ICD-10-CM

## 2021-11-04 DIAGNOSIS — O99213 Obesity complicating pregnancy, third trimester: Secondary | ICD-10-CM | POA: Insufficient documentation

## 2021-11-04 DIAGNOSIS — N858 Other specified noninflammatory disorders of uterus: Secondary | ICD-10-CM | POA: Insufficient documentation

## 2021-11-04 DIAGNOSIS — Z3689 Encounter for other specified antenatal screening: Secondary | ICD-10-CM | POA: Insufficient documentation

## 2021-11-04 DIAGNOSIS — O34219 Maternal care for unspecified type scar from previous cesarean delivery: Secondary | ICD-10-CM | POA: Insufficient documentation

## 2021-11-04 DIAGNOSIS — Z3A37 37 weeks gestation of pregnancy: Secondary | ICD-10-CM | POA: Insufficient documentation

## 2021-11-04 DIAGNOSIS — O24419 Gestational diabetes mellitus in pregnancy, unspecified control: Secondary | ICD-10-CM | POA: Insufficient documentation

## 2021-11-04 DIAGNOSIS — E669 Obesity, unspecified: Secondary | ICD-10-CM | POA: Insufficient documentation

## 2021-11-04 NOTE — MAU Note (Signed)
..  Shannon Diaz is a 31 y.o. at [redacted]w[redacted]d here in MAU reporting: DFM for the last two hours since 2130. Pt reports baby has been moving less throughout day. Pt stated she attempted to get baby to move, but no response. Pt denies PIH s/s, VB, LOF, abnormal discharge, and recent intercourse.  ?PreE, DM2 on Metformin  ?GBS neg ?Onset of complaint: 2130 ?Pain score: 0/10 ?Vitals:  ? 11/05/21 0001  ?BP: 126/74  ?Pulse: 96  ?Resp: 20  ?Temp: 98 ?F (36.7 ?C)  ?SpO2: 100%  ?   ?FHT:155 ?Lab orders placed from triage:  none ? ?

## 2021-11-05 ENCOUNTER — Encounter (HOSPITAL_COMMUNITY): Payer: Self-pay | Admitting: Obstetrics and Gynecology

## 2021-11-05 ENCOUNTER — Ambulatory Visit (INDEPENDENT_AMBULATORY_CARE_PROVIDER_SITE_OTHER): Payer: Medicaid Other

## 2021-11-05 VITALS — BP 130/85 | HR 101 | Wt 242.8 lb

## 2021-11-05 DIAGNOSIS — O34219 Maternal care for unspecified type scar from previous cesarean delivery: Secondary | ICD-10-CM | POA: Diagnosis not present

## 2021-11-05 DIAGNOSIS — O368130 Decreased fetal movements, third trimester, not applicable or unspecified: Secondary | ICD-10-CM

## 2021-11-05 DIAGNOSIS — Z331 Pregnant state, incidental: Secondary | ICD-10-CM

## 2021-11-05 DIAGNOSIS — O0993 Supervision of high risk pregnancy, unspecified, third trimester: Secondary | ICD-10-CM

## 2021-11-05 DIAGNOSIS — Z3689 Encounter for other specified antenatal screening: Secondary | ICD-10-CM

## 2021-11-05 DIAGNOSIS — O24419 Gestational diabetes mellitus in pregnancy, unspecified control: Secondary | ICD-10-CM | POA: Diagnosis not present

## 2021-11-05 DIAGNOSIS — O288 Other abnormal findings on antenatal screening of mother: Secondary | ICD-10-CM

## 2021-11-05 DIAGNOSIS — Z3A37 37 weeks gestation of pregnancy: Secondary | ICD-10-CM | POA: Diagnosis not present

## 2021-11-05 DIAGNOSIS — O99213 Obesity complicating pregnancy, third trimester: Secondary | ICD-10-CM | POA: Diagnosis not present

## 2021-11-05 DIAGNOSIS — N858 Other specified noninflammatory disorders of uterus: Secondary | ICD-10-CM | POA: Diagnosis not present

## 2021-11-05 DIAGNOSIS — E669 Obesity, unspecified: Secondary | ICD-10-CM | POA: Diagnosis not present

## 2021-11-05 DIAGNOSIS — Z1389 Encounter for screening for other disorder: Secondary | ICD-10-CM

## 2021-11-05 NOTE — MAU Provider Note (Signed)
?History  ?  ? ?CSN: [id number] ? ?Arrival date and time: 11/04/21 2349 ? ? None  ?  ? ?Chief Complaint  ?Patient presents with  ? Decreased Fetal Movement  ? ?HPI ?Shannon Diaz is a 31 y.o G3P1102 at [redacted]w[redacted]d who presents to MAU for decreased fetal movement. Patient reports decreased fetal movement for the past 2 hours since 9pm, however since her arrival to MAU she reports she is feeling normal fetal movement. She denies contractions, leaking fluid, or vaginal bleeding.  ? ?She [facility] at [facility]. Pregnancy is complicated by A2GDM and previous c-section.  ? ?OB History   ? ? Gravida  ?3  ? Para  ?2  ? Term  ?1  ? Preterm  ?1  ? AB  ?0  ? Living  ?2  ?  ? ? SAB  ?0  ? IAB  ?0  ? Ectopic  ?0  ? Multiple  ?0  ? Live Births  ?2  ?   ?  ?  ? ? ?Past Medical History:  ?Diagnosis Date  ? Late prenatal care   ? 22 weeks  ? No pertinent past medical history   ? Sickle cell trait (HCC)   ? HgbS trait positive  ? Trichomonas vaginalis (TV) infection   ? During pregnancy  ? ? ?Past Surgical History:  ?Procedure Laterality Date  ? CESAREAN SECTION  09/05/2012  ? Procedure: CESAREAN SECTION;  Surgeon: Allie Bossier, MD;  Location: [facility];  Service: Obstetrics;  Laterality: N/A;  Primary cesarean section of baby boy at 2342 APGAR 7/8  ? LIPOMA EXCISION Left 06/22/2021  ? Procedure: MINOR EXCISION MASS LEG;  Surgeon: Lucretia Roers, MD;  Location: [facility];  Service: General;  Laterality: Left;  ? NO PAST SURGERIES    ? ? ?Family History  ?Problem Relation Age of Onset  ? Hypertension Maternal Grandmother   ? Diabetes Maternal Grandmother   ? Diabetes Maternal Grandfather   ? Hypertension Mother   ? Diabetes Mother   ? Diabetes Other   ? ? ?Social History  ? ?Tobacco Use  ? Smoking status: Never  ? Smokeless tobacco: Never  ?Vaping Use  ? Vaping Use: Former  ?Substance Use Topics  ? Alcohol use: Not Currently  ? Drug use: No  ? ? ?Allergies:  ?Allergies  ?Allergen Reactions  ? Penicillins Swelling and Rash   ?  Skin gets red, experiences swelling  ? ? ?No medications prior to admission.  ? ? ?Review of Systems  ?Constitutional: Negative.   ?Respiratory: Negative.    ?Cardiovascular: Negative.   ?Gastrointestinal: Negative.   ?Genitourinary: Negative.   ?Musculoskeletal: Negative.   ?Neurological: Negative.   ?Physical Exam  ? ?Patient Vitals for the past 24 hrs: ? BP Temp Temp src Pulse Resp SpO2 Height Weight  ?11/05/21 0125 133/81 98 ?F (36.7 ?C) Oral 98 -- -- -- --  ?11/05/21 0027 117/74 -- -- 90 -- -- -- --  ?11/05/21 0001 126/74 98 ?F (36.7 ?C) Oral 96 20 100 % 5\' 1"  (1.549 m) 110.7 kg  ? ? ?Physical Exam ?Vitals and nursing note reviewed.  ?Constitutional:   ?   Appearance: She is obese.  ?Eyes:  ?   Extraocular Movements: Extraocular movements intact.  ?   Pupils: Pupils are equal, round, and reactive to light.  ?Cardiovascular:  ?   Rate and Rhythm: Normal rate.  ?Pulmonary:  ?   Effort: Pulmonary effort is normal.  ?Abdominal:  ?  Palpations: Abdomen is soft.  ?   Tenderness: There is no abdominal tenderness.  ?   Comments: gravid  ?Musculoskeletal:     ?   General: Normal range of motion.  ?   Cervical back: Normal range of motion.  ?Skin: ?   General: Skin is warm and dry.  ?Neurological:  ?   General: No focal deficit present.  ?   Mental Status: She is alert and oriented to person, place, and time.  ?Psychiatric:     ?   Mood and Affect: Mood normal.     ?   Behavior: Behavior normal.     ?   Thought Content: Thought content normal.     ?   Judgment: Judgment normal.  ? ?NST ?FHR: 135 bpm, moderate variability, +15x15 accels, no decels ?Toco: quiet ? ?MAU Course  ?Procedures ?NST ? ?MDM ?NST reactive and reassuring. Patient reports feeling normal movement. Pushed NST clicker 90 times in 1 hour period. ? ?Assessment and Plan  ?[redacted] weeks gestation of pregnancy ?Decreased fetal movement ?NST reactive ? ?- Discharge home in stable condition ?- Strict return precautions reviewed. Return to MAU sooner or as  needed for worsening symptoms ?- Keep OB appointment as scheduled for today ? ? ?Brand Males, CNM ?11/05/2021, 1:46 AM  ?

## 2021-11-05 NOTE — Progress Notes (Signed)
? ?  NURSE VISIT- NST ? ?SUBJECTIVE:  ?Shannon Diaz is a 31 y.o. [phone] female at [redacted]w[redacted]d, here for a NST for pregnancy complicated by A2DM currently on Metformin 1000 mg AM, 500 mg PM .  She reports active fetal movement, contractions: none, vaginal bleeding: none, membranes: intact.  ? ?OBJECTIVE:  ?BP 130/85   Pulse (!) 101   Wt 242 lb 12.8 oz (110.1 kg)   LMP 03/26/2021 (Approximate)   BMI 45.88 kg/m?   ?Appears well, no apparent distress ? ?No results found for this or any previous visit (from the past 24 hour(s)). ? ?NST: FHR baseline 145 bpm, Variability: moderate, Accelerations:present, Decelerations:  Absent= Cat 1/reactive ?Toco: none  ? ?ASSESSMENT: ?[id number] at [redacted]w[redacted]d with A2DM currently on Metformin 1000 mg AM, 500 mg PM ?NST reactive ? ?PLAN: ?EFM strip interpreted and reviewed by Joellyn Haff, CNM, WHNP   ?Recommendations: keep next appointment as scheduled   ? ?Shannon Diaz  ?11/05/2021 ?1:38 PM  ?

## 2021-11-08 ENCOUNTER — Other Ambulatory Visit: Payer: Self-pay

## 2021-11-08 ENCOUNTER — Ambulatory Visit (INDEPENDENT_AMBULATORY_CARE_PROVIDER_SITE_OTHER): Payer: Medicaid Other | Admitting: Obstetrics & Gynecology

## 2021-11-08 ENCOUNTER — Encounter: Payer: Self-pay | Admitting: Obstetrics & Gynecology

## 2021-11-08 ENCOUNTER — Other Ambulatory Visit: Payer: Medicaid Other

## 2021-11-08 ENCOUNTER — Encounter: Payer: Medicaid Other | Admitting: Obstetrics & Gynecology

## 2021-11-08 VITALS — BP 144/88 | HR 97 | Wt 245.0 lb

## 2021-11-08 DIAGNOSIS — O288 Other abnormal findings on antenatal screening of mother: Secondary | ICD-10-CM

## 2021-11-08 DIAGNOSIS — Z1389 Encounter for screening for other disorder: Secondary | ICD-10-CM

## 2021-11-08 DIAGNOSIS — Z331 Pregnant state, incidental: Secondary | ICD-10-CM

## 2021-11-08 DIAGNOSIS — O0990 Supervision of high risk pregnancy, unspecified, unspecified trimester: Secondary | ICD-10-CM

## 2021-11-08 DIAGNOSIS — O24415 Gestational diabetes mellitus in pregnancy, controlled by oral hypoglycemic drugs: Secondary | ICD-10-CM

## 2021-11-08 LAB — POCT URINALYSIS DIPSTICK OB
Blood, UA: NEGATIVE
Glucose, UA: NEGATIVE
Ketones, UA: NEGATIVE
Leukocytes, UA: NEGATIVE
Nitrite, UA: NEGATIVE

## 2021-11-08 NOTE — Progress Notes (Signed)
? ?HIGH-RISK PREGNANCY VISIT ?Patient name: Shannon Diaz MRN [id number]  Date of birth: [birth year] ?Chief Complaint:   ?High Risk Gestation and Routine Prenatal Visit ? ?History of Present Illness:   ?Shannon Diaz is a 31 y.o. [phone] female at [redacted]w[redacted]d with an Estimated Date of Delivery: 11/22/21 being seen today for ongoing management of a high-risk pregnancy complicated by [id number] on metformin 1000 mg in AM and 500 mg at bedtime.   ? ?Today she reports no complaints. Contractions: Irregular.  .  Movement: Absent. denies leaking of fluid.  ? ?Depression screen [facility] 2/9 07/25/2021 05/30/2021 05/01/2016  ?Decreased Interest 0 0 0  ?Down, Depressed, Hopeless 0 0 0  ?PHQ - 2 Score 0 0 0  ?Altered sleeping 0 - -  ?Tired, decreased energy 0 - -  ?Change in appetite 0 - -  ?Feeling bad or failure about yourself  0 - -  ?Trouble concentrating 0 - -  ?Moving slowly or fidgety/restless 0 - -  ?Suicidal thoughts 0 - -  ?PHQ-9 Score 0 - -  ? ?  ?GAD 7 : Generalized Anxiety Score 07/25/2021  ?Nervous, Anxious, on Edge 0  ?Control/stop worrying 0  ?Worry too much - different things 0  ?Trouble relaxing 0  ?Restless 0  ?Easily annoyed or irritable 0  ?Afraid - awful might happen 0  ?Total GAD 7 Score 0  ? ? ? ?Review of Systems:   ?Pertinent items are noted in HPI ?Denies abnormal vaginal discharge w/ itching/odor/irritation, headaches, visual changes, shortness of breath, chest pain, abdominal pain, severe nausea/vomiting, or problems with urination or bowel movements unless otherwise stated above. ?Pertinent History Reviewed:  ?Reviewed past medical,surgical, social, obstetrical and family history.  ?Reviewed problem list, medications and allergies. ?Physical Assessment:  ? ?Vitals:  ? 11/08/21 0957  ?BP: (!) 144/88  ?Pulse: 97  ?Weight: 245 lb (111.1 kg)  ?Body mass index is 46.29 kg/m?. ?     ?     Physical Examination:  ? General appearance: alert, well appearing, and in no distress ? Mental status: alert, oriented  to person, place, and time ? Skin: warm & dry  ? Extremities: Edema: Trace  ?  Cardiovascular: normal heart rate noted ? Respiratory: normal respiratory effort, no distress ? Abdomen: gravid, soft, non-tender ? Pelvic: Cervical exam deferred        ? ?Fetal Status:     Movement: Absent   ? ?Fetal Surveillance Testing today: Reactive NST ? ?Shannon Diaz is at [redacted]w[redacted]d Estimated Date of Delivery: 11/22/21  ?NST being performed due to A2DM ? ?Today the NST is Reactive ? ?Fetal Monitoring:  Baseline: 140 bpm, Variability: Good {> 6 bpm), Accelerations: Reactive, and Decelerations: Absent   reactive ? ?The accelerations are >15 bpm and more than 2 in 20 minutes ? ?Final diagnosis:  Reactive NST ? ?Shannon Buff, MD  ?  ? ?Chaperone: N/A   ? ?Results for orders placed or performed in visit on 11/08/21 (from the past 24 hour(s))  ?POC Urinalysis Dipstick OB  ? Collection Time: 11/08/21 10:16 AM  ?Result Value Ref Range  ? Color, UA    ? Clarity, UA    ? Glucose, UA Negative Negative  ? Bilirubin, UA    ? Ketones, UA neg   ? Spec Grav, UA    ? Blood, UA neg   ? pH, UA    ? POC,PROTEIN,UA Trace Negative, Trace, Small (1+), Moderate (2+), Large (3+), 4+  ? Urobilinogen, UA    ?  Nitrite, UA neg   ? Leukocytes, UA Negative Negative  ? Appearance    ? Odor    ?  ?Assessment & Plan:  ?High-risk pregnancy: [id number] at 101w0d with an Estimated Date of Delivery: 11/22/21  ? ?1) A2DM, on metformin 1000 am, 500 qhs, good control ? ?2) Previous C section,  ? ?Watch BP, if still elevated Monday recommend proceeding with IOL that day as opposed to 39 weeks ? ?Meds: No orders of the defined types were placed in this encounter. ? ? ?Labs/procedures today: NST ? ?Treatment Plan:  IOL next week if undelivered, NST Monday ? ?Reviewed:  labor symptoms and general obstetric precautions including but not limited to vaginal bleeding, contractions, leaking of fluid and fetal movement were reviewed in detail with the patient.  All questions were  answered.  have home bp cuff. Office bp cuff given: Marland Kitchen Check bp , let us know if consistently . ? ?Follow-up: No follow-ups on file. ? ? ?Future Appointments  ?Date Time Provider [place]  ?11/12/2021  3:00 PM [place] - [facility] [place] [facility] None  ?11/12/2021  3:50 PM Shannon Buff, MD [facility]  ?11/15/2021  9:30 AM [facility] NURSE [facility]  ?11/19/2021  9:30 AM [facility] NURSE [facility]  ?11/22/2021  9:50 AM Cresenzo-Dishmon, Joaquim Lai, CNM [facility]  ? ? ?Orders Placed This Encounter  ?Procedures  ? POC Urinalysis Dipstick OB  ? ?Shannon Diaz  ?11/08/2021 ?11:02 AM ? ?

## 2021-11-09 ENCOUNTER — Other Ambulatory Visit: Payer: Self-pay | Admitting: Obstetrics & Gynecology

## 2021-11-09 DIAGNOSIS — O24419 Gestational diabetes mellitus in pregnancy, unspecified control: Secondary | ICD-10-CM

## 2021-11-12 ENCOUNTER — Ambulatory Visit (INDEPENDENT_AMBULATORY_CARE_PROVIDER_SITE_OTHER): Payer: Medicaid Other | Admitting: Obstetrics & Gynecology

## 2021-11-12 ENCOUNTER — Ambulatory Visit (INDEPENDENT_AMBULATORY_CARE_PROVIDER_SITE_OTHER): Payer: Medicaid Other

## 2021-11-12 ENCOUNTER — Other Ambulatory Visit: Payer: Medicaid Other

## 2021-11-12 ENCOUNTER — Other Ambulatory Visit: Payer: Self-pay

## 2021-11-12 ENCOUNTER — Encounter: Payer: Self-pay | Admitting: Obstetrics & Gynecology

## 2021-11-12 VITALS — BP 130/87 | HR 102 | Wt 244.0 lb

## 2021-11-12 DIAGNOSIS — O0993 Supervision of high risk pregnancy, unspecified, third trimester: Secondary | ICD-10-CM

## 2021-11-12 DIAGNOSIS — O24419 Gestational diabetes mellitus in pregnancy, unspecified control: Secondary | ICD-10-CM | POA: Diagnosis not present

## 2021-11-12 DIAGNOSIS — O24415 Gestational diabetes mellitus in pregnancy, controlled by oral hypoglycemic drugs: Secondary | ICD-10-CM

## 2021-11-12 DIAGNOSIS — Z3A38 38 weeks gestation of pregnancy: Secondary | ICD-10-CM

## 2021-11-12 DIAGNOSIS — O0990 Supervision of high risk pregnancy, unspecified, unspecified trimester: Secondary | ICD-10-CM

## 2021-11-12 LAB — POCT URINALYSIS DIPSTICK OB
Blood, UA: NEGATIVE
Glucose, UA: NEGATIVE
Nitrite, UA: NEGATIVE
POC,PROTEIN,UA: NEGATIVE

## 2021-11-12 NOTE — Progress Notes (Signed)
[place] [id number] wks,cephalic,BPP [id number] BPM,posterior fundal placenta gr 3,AFI 10.7 cm ?

## 2021-11-12 NOTE — Progress Notes (Signed)
? ?HIGH-RISK PREGNANCY VISIT ?Patient name: Shannon Diaz MRN [id number]  Date of birth: 02/25/1991 ?Chief Complaint:   ?Routine Prenatal Visit ? ?History of Present Illness:   ?Shannon Diaz is a 31 y.o. [phone] female at [redacted]w[redacted]d with an Estimated Date of Delivery: 11/22/21 being seen today for ongoing management of a high-risk pregnancy complicated by diabetes mellitus A2DM currently on metformin , and history of previous C section for TOLAC .   ? ?Today she reports no complaints. Contractions: Irritability. Vag. Bleeding: None.  Movement: Present. denies leaking of fluid.  ? ?Depression screen [facility] 2/9 07/25/2021 05/30/2021 05/01/2016  ?Decreased Interest 0 0 0  ?Down, Depressed, Hopeless 0 0 0  ?PHQ - 2 Score 0 0 0  ?Altered sleeping 0 - -  ?Tired, decreased energy 0 - -  ?Change in appetite 0 - -  ?Feeling bad or failure about yourself  0 - -  ?Trouble concentrating 0 - -  ?Moving slowly or fidgety/restless 0 - -  ?Suicidal thoughts 0 - -  ?PHQ-9 Score 0 - -  ? ?  ?GAD 7 : Generalized Anxiety Score 07/25/2021  ?Nervous, Anxious, on Edge 0  ?Control/stop worrying 0  ?Worry too much - different things 0  ?Trouble relaxing 0  ?Restless 0  ?Easily annoyed or irritable 0  ?Afraid - awful might happen 0  ?Total GAD 7 Score 0  ? ? ? ?Review of Systems:   ?Pertinent items are noted in HPI ?Denies abnormal vaginal discharge w/ itching/odor/irritation, headaches, visual changes, shortness of breath, chest pain, abdominal pain, severe nausea/vomiting, or problems with urination or bowel movements unless otherwise stated above. ?Pertinent History Reviewed:  ?Reviewed past medical,surgical, social, obstetrical and family history.  ?Reviewed problem list, medications and allergies. ?Physical Assessment:  ? ?Vitals:  ? 11/12/21 1545 11/12/21 1550  ?BP: (!) 136/93 130/87  ?Pulse: 97 (!) 102  ?Weight: 244 lb (110.7 kg)   ?Body mass index is 46.1 kg/m?. ?     ?     Physical Examination:  ? General appearance: alert,  well appearing, and in no distress ? Mental status: alert, oriented to person, place, and time ? Skin: warm & dry  ? Extremities: Edema: Trace  ?  Cardiovascular: normal heart rate noted ? Respiratory: normal respiratory effort, no distress ? Abdomen: gravid, soft, non-tender ? Pelvic: Cervical exam deferred        ? ?Fetal Status:     Movement: Present   ? ?Fetal Surveillance Testing today: BPP 8/8  ? ?Chaperone: N/A   ? ?Results for orders placed or performed in visit on 11/12/21 (from the past 24 hour(s))  ?POC Urinalysis Dipstick OB  ? Collection Time: 11/12/21  3:48 PM  ?Result Value Ref Range  ? Color, UA    ? Clarity, UA    ? Glucose, UA Negative Negative  ? Bilirubin, UA    ? Ketones, UA small   ? Spec Grav, UA    ? Blood, UA neg   ? pH, UA    ? POC,PROTEIN,UA Negative Negative, Trace, Small (1+), Moderate (2+), Large (3+), 4+  ? Urobilinogen, UA    ? Nitrite, UA neg   ? Leukocytes, UA Trace (A) Negative  ? Appearance    ? Odor    ?  ?Assessment & Plan:  ?High-risk pregnancy: [id number] at [redacted]w[redacted]d with an Estimated Date of Delivery: 11/22/21  ? ?1) A2 DM on metformin 1000 mg in am 500 pm with excellent control EFW extrapolates to 4100  grams, stable, IOL schedule 11/15/21 ? ?2) Previous C section, desires TOLAC ? ?Meds: No orders of the defined types were placed in this encounter. ? ? ?Labs/procedures today: sonogram ? ?Treatment Plan:  IOL 11/15/21 ? ?Reviewed: Term labor symptoms and general obstetric precautions including but not limited to vaginal bleeding, contractions, leaking of fluid and fetal movement were reviewed in detail with the patient.  All questions were answered. Does have home bp cuff. Office bp cuff given: not applicable. Check bp weekly, let us know if consistently >140 and/or >90. ? ?Follow-up: No follow-ups on file. ? ? ?Future Appointments  ?Date Time Provider [place]  ?11/15/2021  9:30 AM [facility] NURSE [facility]  ?11/19/2021  9:30 AM [facility] NURSE [facility]   ?11/22/2021  9:50 AM Cresenzo-Dishmon, Joaquim Lai, CNM [facility]  ? ? ?Orders Placed This Encounter  ?Procedures  ? POC Urinalysis Dipstick OB  ? ?[place]  ?11/12/2021 ?4:02 PM ? ?

## 2021-11-12 NOTE — Treatment Plan (Signed)
? ?  Induction Assessment Scheduling Form: ?Fax to [facility]:  [phone] ? ?Shannon Diaz                                                                                   ?DOB:  12-27-1990                                                            ?MRN:  [id number]                                                                     ?Phone #:   [phone]                         ?Provider:  [facility] ? ?GP:  [id number]                                                            ?Estimated Date of Delivery: 11/22/21  ?Dating Criteria: 1st trimester sonogram + LMP ?  ? ?Medical Indications for induction:  A2DM ?Admission Date/Time:  11/15/21 am ?Gestational age on admission:  [redacted]w[redacted]d ? ? ?Filed Weights  ? 11/12/21 1545  ?Weight: 244 lb (110.7 kg)  ? ?HIV:  Non Reactive (11/23 1605) ?GBS: --[name] (02/23 1555) ? ?Bishop cervix not checked ? ? ?Method of induction(proposed):  foley, pitocin ? ? ?Scheduling Provider Signature:  Lazaro Arms, MD                                            ?Today's Date:  11/12/2021  ? ? ? ? ?

## 2021-11-13 ENCOUNTER — Telehealth (HOSPITAL_COMMUNITY): Payer: Self-pay | Admitting: *Deleted

## 2021-11-13 ENCOUNTER — Encounter (HOSPITAL_COMMUNITY): Payer: Self-pay | Admitting: Obstetrics and Gynecology

## 2021-11-13 ENCOUNTER — Encounter (HOSPITAL_COMMUNITY): Payer: Self-pay | Admitting: *Deleted

## 2021-11-13 NOTE — Telephone Encounter (Signed)
Preadmission screen  

## 2021-11-14 ENCOUNTER — Other Ambulatory Visit: Payer: Self-pay | Admitting: Advanced Practice Midwife

## 2021-11-15 ENCOUNTER — Inpatient Hospital Stay (HOSPITAL_COMMUNITY)
Admission: AD | Admit: 2021-11-15 | Discharge: 2021-11-18 | DRG: 783 | Disposition: A | Discharge status: Home / Self Care | Payer: Medicaid Other | Attending: Family Medicine | Admitting: Family Medicine

## 2021-11-15 ENCOUNTER — Inpatient Hospital Stay (HOSPITAL_COMMUNITY): Payer: Medicaid Other

## 2021-11-15 ENCOUNTER — Other Ambulatory Visit: Payer: Medicaid Other

## 2021-11-15 ENCOUNTER — Encounter (HOSPITAL_COMMUNITY): Payer: Self-pay | Admitting: Obstetrics & Gynecology

## 2021-11-15 ENCOUNTER — Other Ambulatory Visit: Payer: Self-pay

## 2021-11-15 DIAGNOSIS — O411230 Chorioamnionitis, third trimester, not applicable or unspecified: Secondary | ICD-10-CM | POA: Diagnosis present

## 2021-11-15 DIAGNOSIS — O24425 Gestational diabetes mellitus in childbirth, controlled by oral hypoglycemic drugs: Principal | ICD-10-CM | POA: Diagnosis present

## 2021-11-15 DIAGNOSIS — Z98891 History of uterine scar from previous surgery: Secondary | ICD-10-CM

## 2021-11-15 DIAGNOSIS — O9081 Anemia of the puerperium: Secondary | ICD-10-CM | POA: Diagnosis not present

## 2021-11-15 DIAGNOSIS — D573 Sickle-cell trait: Secondary | ICD-10-CM | POA: Diagnosis present

## 2021-11-15 DIAGNOSIS — Z880 Allergy status to penicillin: Secondary | ICD-10-CM | POA: Diagnosis not present

## 2021-11-15 DIAGNOSIS — O24415 Gestational diabetes mellitus in pregnancy, controlled by oral hypoglycemic drugs: Secondary | ICD-10-CM | POA: Diagnosis present

## 2021-11-15 DIAGNOSIS — Z302 Encounter for sterilization: Secondary | ICD-10-CM | POA: Diagnosis not present

## 2021-11-15 DIAGNOSIS — O134 Gestational [pregnancy-induced] hypertension without significant proteinuria, complicating childbirth: Secondary | ICD-10-CM | POA: Diagnosis present

## 2021-11-15 DIAGNOSIS — O139 Gestational [pregnancy-induced] hypertension without significant proteinuria, unspecified trimester: Secondary | ICD-10-CM

## 2021-11-15 DIAGNOSIS — O34211 Maternal care for low transverse scar from previous cesarean delivery: Secondary | ICD-10-CM | POA: Diagnosis present

## 2021-11-15 DIAGNOSIS — Z3A39 39 weeks gestation of pregnancy: Secondary | ICD-10-CM

## 2021-11-15 DIAGNOSIS — O0930 Supervision of pregnancy with insufficient antenatal care, unspecified trimester: Secondary | ICD-10-CM

## 2021-11-15 DIAGNOSIS — O3663X0 Maternal care for excessive fetal growth, third trimester, not applicable or unspecified: Secondary | ICD-10-CM | POA: Diagnosis present

## 2021-11-15 DIAGNOSIS — O0990 Supervision of high risk pregnancy, unspecified, unspecified trimester: Principal | ICD-10-CM

## 2021-11-15 DIAGNOSIS — D62 Acute posthemorrhagic anemia: Secondary | ICD-10-CM | POA: Diagnosis not present

## 2021-11-15 LAB — COMPREHENSIVE METABOLIC PANEL
ALT: 18 U/L (ref 0–44)
AST: 20 U/L (ref 15–41)
Albumin: 2.7 g/dL — ABNORMAL LOW (ref 3.5–5.0)
Alkaline Phosphatase: 120 U/L (ref 38–126)
Anion gap: 9 (ref 5–15)
BUN: 8 mg/dL (ref 6–20)
CO2: 20 mmol/L — ABNORMAL LOW (ref 22–32)
Calcium: 8.5 mg/dL — ABNORMAL LOW (ref 8.9–10.3)
Chloride: 106 mmol/L (ref 98–111)
Creatinine, Ser: 0.73 mg/dL (ref 0.44–1.00)
GFR, Estimated: >60 mL/min (ref >60)
Glucose, Bld: 76 mg/dL (ref 70–99)
Potassium: 4.1 mmol/L (ref 3.5–5.1)
Sodium: 135 mmol/L (ref 135–145)
Total Bilirubin: 0.2 mg/dL — ABNORMAL LOW (ref 0.3–1.2)
Total Protein: 7 g/dL (ref 6.5–8.1)

## 2021-11-15 LAB — CBC
HCT: 33.8 % — ABNORMAL LOW (ref 36.0–46.0)
Hemoglobin: 11.1 g/dL — ABNORMAL LOW (ref 12.0–15.0)
MCH: 24 pg — ABNORMAL LOW (ref 26.0–34.0)
MCHC: 32.8 g/dL (ref 30.0–36.0)
MCV: 73 fL — ABNORMAL LOW (ref 80.0–100.0)
Platelets: 354 10*3/uL (ref 150–400)
RBC: 4.63 MIL/uL (ref 3.87–5.11)
RDW: 16.8 % — ABNORMAL HIGH (ref 11.5–15.5)
WBC: 10.8 10*3/uL — ABNORMAL HIGH (ref 4.0–10.5)
nRBC: 0 % (ref 0.0–0.2)

## 2021-11-15 LAB — PROTEIN / CREATININE RATIO, URINE
Creatinine, Urine: 73.38 mg/dL
Protein Creatinine Ratio: 0.15 mg/mg{Cre} (ref 0.00–0.15)
Total Protein, Urine: 11 mg/dL

## 2021-11-15 LAB — RAPID HIV SCREEN (HIV 1/2 AB+AG)
HIV 1/2 Antibodies: NONREACTIVE
HIV-1 P24 Antigen - HIV24: NONREACTIVE

## 2021-11-15 LAB — TYPE AND SCREEN
ABO/RH(D): A POS
Antibody Screen: NEGATIVE

## 2021-11-15 LAB — GLUCOSE, CAPILLARY: Glucose-Capillary: 82 mg/dL (ref 70–99)

## 2021-11-15 MED ORDER — EPHEDRINE 5 MG/ML INJ: 10.0000 mg | INTRAVENOUS | Status: DC | PRN

## 2021-11-15 MED ORDER — OXYCODONE-ACETAMINOPHEN 5-325 MG PO TABS: 1.0000 | ORAL_TABLET | ORAL | Status: DC | PRN

## 2021-11-15 MED ORDER — FENTANYL-BUPIVACAINE-NACL 0.5-0.125-0.9 MG/250ML-% EP SOLN
12.0000 mL/h | EPIDURAL | Status: DC | PRN
  Administered 2021-11-16: 11 mL/h via EPIDURAL
  Filled 2021-11-15: qty 250

## 2021-11-15 MED ORDER — TERBUTALINE SULFATE 1 MG/ML IJ SOLN: 0.2500 mg | Freq: Once | INTRAMUSCULAR | Status: DC | PRN

## 2021-11-15 MED ORDER — OXYCODONE-ACETAMINOPHEN 5-325 MG PO TABS: 2.0000 | ORAL_TABLET | ORAL | Status: DC | PRN

## 2021-11-15 MED ORDER — DIPHENHYDRAMINE HCL 50 MG/ML IJ SOLN: 12.5000 mg | INTRAMUSCULAR | Status: DC | PRN

## 2021-11-15 MED ORDER — LIDOCAINE HCL (PF) 1 % IJ SOLN: 30.0000 mL | INTRAMUSCULAR | Status: DC | PRN

## 2021-11-15 MED ORDER — PHENYLEPHRINE 40 MCG/ML (10ML) SYRINGE FOR IV PUSH (FOR BLOOD PRESSURE SUPPORT): 80.0000 ug | PREFILLED_SYRINGE | INTRAVENOUS | Status: DC | PRN

## 2021-11-15 MED ORDER — OXYTOCIN-SODIUM CHLORIDE 30-0.9 UT/500ML-% IV SOLN
1.0000 m[IU]/min | INTRAVENOUS | Status: DC
  Administered 2021-11-15: 2 m[IU]/min via INTRAVENOUS
  Filled 2021-11-15: qty 500

## 2021-11-15 MED ORDER — OXYTOCIN-SODIUM CHLORIDE 30-0.9 UT/500ML-% IV SOLN
2.5000 [IU]/h | INTRAVENOUS | Status: DC
  Administered 2021-11-16: 30 [IU] via INTRAVENOUS

## 2021-11-15 MED ORDER — ONDANSETRON HCL 4 MG/2ML IJ SOLN
4.0000 mg | Freq: Four times a day (QID) | INTRAMUSCULAR | Status: DC | PRN
  Administered 2021-11-15: 4 mg via INTRAVENOUS
  Filled 2021-11-15: qty 2

## 2021-11-15 MED ORDER — FENTANYL CITRATE (PF) 100 MCG/2ML IJ SOLN
50.0000 ug | INTRAMUSCULAR | Status: DC | PRN
  Administered 2021-11-15: 50 ug via INTRAVENOUS
  Administered 2021-11-16 (×2): 100 ug via INTRAVENOUS
  Filled 2021-11-15 (×4): qty 2

## 2021-11-15 MED ORDER — ACETAMINOPHEN 325 MG PO TABS
650.0000 mg | ORAL_TABLET | ORAL | Status: DC | PRN
  Administered 2021-11-15 – 2021-11-16 (×2): 650 mg via ORAL
  Filled 2021-11-15 (×2): qty 2

## 2021-11-15 MED ORDER — OXYTOCIN BOLUS FROM INFUSION: 333.0000 mL | Freq: Once | INTRAVENOUS | Status: DC

## 2021-11-15 MED ORDER — SOD CITRATE-CITRIC ACID 500-334 MG/5ML PO SOLN
30.0000 mL | ORAL | Status: DC | PRN
  Administered 2021-11-16: 30 mL via ORAL
  Filled 2021-11-15: qty 30

## 2021-11-15 MED ORDER — LACTATED RINGERS IV SOLN
500.0000 mL | Freq: Once | INTRAVENOUS | Status: AC
  Administered 2021-11-16: 500 mL via INTRAVENOUS

## 2021-11-15 MED ORDER — LACTATED RINGERS IV SOLN
INTRAVENOUS | Status: DC
  Administered 2021-11-15 – 2021-11-16 (×2): 1000 mL via INTRAVENOUS

## 2021-11-15 MED ORDER — LACTATED RINGERS IV SOLN
500.0000 mL | INTRAVENOUS | Status: DC | PRN
  Administered 2021-11-16: 250 mL via INTRAVENOUS

## 2021-11-15 MED ORDER — PHENYLEPHRINE 40 MCG/ML (10ML) SYRINGE FOR IV PUSH (FOR BLOOD PRESSURE SUPPORT)
80.0000 ug | PREFILLED_SYRINGE | INTRAVENOUS | Status: DC | PRN
  Filled 2021-11-15: qty 10

## 2021-11-15 NOTE — H&P (Signed)
OBSTETRIC ADMISSION HISTORY AND PHYSICAL ? ?Shannon Diaz is a 31 y.o. female [phone] with IUP at 78w0dby 14 week ultrasound presenting for IOL for A2GDM on metformin. She reports +FMs, No LOF, no VB, no blurry vision, headaches or peripheral edema, or RUQ pain.  She plans on breast and bottle feeding. She requests BTL for birth control. ?[facility] ? ?Dating: By 14 week ultrasound --->  Estimated Date of Delivery: 11/22/21 ? ?Sono:   ? ?@[redacted]w[redacted]d , CWD, normal anatomy, cephalic presentation, posterior/fundal placental lie, 3113g, 93% EFW ? ?Prenatal History/Complications:  ?AW2XHB history of C-section, desires TOLAC, LGA, elevated BP in the office without official diagnosis  ? ?Past Medical History: ?Past Medical History:  ?Diagnosis Date  ? Gestational diabetes   ? Late prenatal care   ? 22 weeks  ? Pre-eclampsia   ? Sickle cell trait ([place]   ? HgbS trait positive  ? Trichomonas vaginalis (TV) infection   ? During pregnancy  ? ? ?Past Surgical History: ?Past Surgical History:  ?Procedure Laterality Date  ? CESAREAN SECTION  09/05/2012  ? Procedure: CESAREAN SECTION;  Surgeon: MEmily Filbert MD;  [facility];  Service: Obstetrics;  Laterality: N/A;  Primary cesarean section of baby boy at 2342 APGAR 7/8  ? LIPOMA EXCISION Left 06/22/2021  ? Procedure: MINOR EXCISION MASS LEG;  Surgeon: BVirl Cagey MD;  Location: [facility];  Service: General;  Laterality: Left;  ? ? ?Obstetrical History: ?OB History   ? ? Gravida  ?3  ? Para  ?2  ? Term  ?1  ? Preterm  ?1  ? AB  ?0  ? Living  ?2  ?  ? ? SAB  ?0  ? IAB  ?0  ? Ectopic  ?0  ? Multiple  ?0  ? Live Births  ?2  ?   ?  ?  ? ? ?Social History ?Social History  ? ?Socioeconomic History  ? Marital status: Significant Other  ?  Spouse name: Not on file  ? Number of children: Not on file  ? Years of education: Not on file  ? Highest education level: Not on file  ?Occupational History  ? Not on file  ?Tobacco Use  ? Smoking status:  Never  ? Smokeless tobacco: Never  ?Vaping Use  ? Vaping Use: Former  ? Start date: 11/15/2020  ?Substance and Sexual Activity  ? Alcohol use: Not Currently  ? Drug use: No  ? Sexual activity: Yes  ?  Birth control/protection: None  ?Other Topics Concern  ? Not on file  ?Social History Narrative  ? Not on file  ? ?Social Determinants of Health  ? ?Financial Resource Strain: Low Risk   ? Difficulty of Paying Living Expenses: Not very hard  ?Food Insecurity: No Food Insecurity  ? Worried About RCharity fundraiserin the Last Year: Never true  ? Ran Out of Food in the Last Year: Never true  ?Transportation Needs: No Transportation Needs  ? Lack of Transportation (Medical): No  ? Lack of Transportation (Non-Medical): No  ?Physical Activity: Insufficiently Active  ? Days of Exercise per Week: 2 days  ? Minutes of Exercise per Session: 20 min  ?Stress: No Stress Concern Present  ? Feeling of Stress : Not at all  ?Social Connections: Moderately Integrated  ? Frequency of Communication with Friends and Family: More than three times a week  ? Frequency of Social Gatherings with Friends and Family:  Once a week  ? Attends Religious Services: More than 4 times per year  ? Active Member of Clubs or Organizations: No  ? Attends Archivist Meetings: Never  ? Marital Status: Living with partner  ? ? ?Family History: ?Family History  ?Problem Relation Age of Onset  ? Hypertension Maternal Grandmother   ? Diabetes Maternal Grandmother   ? Diabetes Maternal Grandfather   ? Hypertension Mother   ? Diabetes Mother   ? Diabetes Other   ? ? ?Allergies: ?Allergies  ?Allergen Reactions  ? Penicillins Swelling and Rash  ?  Skin gets red, experiences swelling  ? ? ?Medications Prior to Admission  ?Medication Sig Dispense Refill Last Dose  ? aspirin EC 81 MG tablet Take 1 tablet (81 mg total) by mouth daily. Take after 12 weeks for prevention of preeclampsia later in pregnancy 300 tablet 2 11/14/2021  ? metFORMIN (GLUCOPHAGE) 500 MG  tablet Take 2 tabs (1,071m) in the morning, and 5025m(1 tab) at night 90 tablet 3 11/14/2021  ? prenatal vitamin w/FE, FA (PRENATAL 1 + 1) 27-1 MG TABS tablet Take 1 tablet by mouth daily at 12 noon. 30 tablet 12 11/14/2021  ? Accu-Chek Softclix Lancets lancets Check blood sugar 4 times a day. 100 each 12   ? blood glucose meter kit and supplies Dispense based on patient and insurance preference. Use four times daily as directed to check blood sugar (FOR ICD-10 E10.9, E11.9). 1 each 0   ? glucose blood (ACCU-CHEK GUIDE) test strip Check blood sugar four times daily 50 each 12   ? ? ? ?Review of Systems  ? ?All systems reviewed and negative except as stated in HPI ? ?Blood pressure (!) 123/100, pulse 85, temperature 98.5 ?F (36.9 ?C), temperature source Oral, weight 112 kg, last menstrual period 03/26/2021. ?General appearance: alert and cooperative ?Lungs: Normal WOB  ?Heart: regular rate and rhythm ?Abdomen: soft, non-tender ?Pelvic: NEFG ?Extremities: Homans sign is negative, no sign of DVT ?Presentation: cephalic confirmed by BSUS  ?Fetal monitoring: Baseline: 140 bpm, Variability: Good {> 6 bpm), and Accelerations: Reactive ?Uterine activityNone ?Dilation: 1 ?Effacement (%): Thick ?Station: -3 ?Exam by:: BeHiginio Plan ? ?Prenatal labs: ?ABO, Rh: --/--/A POS (03/16 1405) ?Antibody: NEG (03/16 1405) ?Rubella: 4.57 (11/23 1605) ?RPR: Non Reactive (11/23 1605)  ?HBsAg: Negative (11/23 1605)  ?HIV: NON REACTIVE (03/16 1405)  ?GBS: --/NH[place]erson Cloud02/23 1555)  ?2 hr Glucola abnormal ?Genetic screening: panorama normal, AFP normal ?Anatomy US[place]ormal ? ?Prenatal Transfer Tool  ?Maternal Diabetes: Yes:  Diabetes Type:  Insulin/Medication controlled ?Genetic Screening: Normal ?Maternal Ultrasounds/Referrals: Normal ?Fetal Ultrasounds or other Referrals:  None ?Maternal Substance Abuse:  No ?Significant Maternal Medications:  Meds include: Other: Metformin ?Significant Maternal Lab Results: Group B Strep negative ? ?Results for  orders placed or performed during the hospital encounter of 11/15/21 (from the past 24 hour(s))  ?CBC  ? Collection Time: 11/15/21  2:05 PM  ?Result Value Ref Range  ? WBC 10.8 (H) 4.0 - 10.5 K/uL  ? RBC 4.63 3.87 - 5.11 MIL/uL  ? Hemoglobin 11.1 (L) 12.0 - 15.0 g/dL  ? HCT 33.8 (L) 36.0 - 46.0 %  ? MCV 73.0 (L) 80.0 - 100.0 fL  ? MCH 24.0 (L) 26.0 - 34.0 pg  ? MCHC 32.8 30.0 - 36.0 g/dL  ? RDW 16.8 (H) 11.5 - 15.5 %  ? Platelets 354 150 - 400 K/uL  ? nRBC 0.0 0.0 - 0.2 %  ?Comprehensive metabolic panel  ? Collection Time:  11/15/21  2:05 PM  ?Result Value Ref Range  ? Sodium 135 135 - 145 mmol/L  ? Potassium 4.1 3.5 - 5.1 mmol/L  ? Chloride 106 98 - 111 mmol/L  ? CO2 20 (L) 22 - 32 mmol/L  ? Glucose, Bld 76 70 - 99 mg/dL  ? BUN 8 6 - 20 mg/dL  ? Creatinine, Ser 0.73 0.44 - 1.00 mg/dL  ? Calcium 8.5 (L) 8.9 - 10.3 mg/dL  ? Total Protein 7.0 6.5 - 8.1 g/dL  ? Albumin 2.7 (L) 3.5 - 5.0 g/dL  ? AST 20 15 - 41 U/L  ? ALT 18 0 - 44 U/L  ? Alkaline Phosphatase 120 38 - 126 U/L  ? Total Bilirubin 0.2 (L) 0.3 - 1.2 mg/dL  ? GFR, Estimated >60 >60 mL/min  ? Anion gap 9 5 - 15  ?Rapid HIV screen (HIV 1/2 Ab+Ag)  ? Collection Time: 11/15/21  2:05 PM  ?Result Value Ref Range  ? HIV-1 P24 Antigen - HIV24 NON REACTIVE NON REACTIVE  ? HIV 1/2 Antibodies NON REACTIVE NON REACTIVE  ? Interpretation (HIV Ag Ab)    ?  A non reactive test result means that HIV 1 or HIV 2 antibodies and HIV 1 p24 antigen were not detected in the specimen.  ?Type and screen  ? Collection Time: 11/15/21  2:05 PM  ?Result Value Ref Range  ? ABO/RH(D) A POS   ? Antibody Screen NEG   ? Sample Expiration    ?  11/18/2021,2359 ?Performed at [facility], [place] [address] [place], [place] [address] ?  ?Protein / creatinine ratio, urine  ? Collection Time: 11/15/21  2:25 PM  ?Result Value Ref Range  ? Creatinine, Urine 73.38 mg/dL  ? Total Protein, Urine 11 mg/dL  ? Protein Creatinine Ratio 0.15 0.00 - 0.15 mg/mg[Cre]  ? ? ?Patient Active Problem List  ?  Diagnosis Date Noted  ? Gestational diabetes mellitus (GDM) controlled on oral hypoglycemic drug, antepartum 11/15/2021  ? Gestational diabetes 08/06/2021  ? 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 Prediabetes 11/26/202

## 2021-11-15 NOTE — Progress Notes (Signed)
Shannon Diaz is a 31 y.o. [id number] at [redacted]w[redacted]d  admitted for TOLAC/induction of labor due to A2GDM and suspected macrosomia (93%ile) ? ?Subjective: ?Patient reports feeling some cramping with contractions. Otherwise feels well. Rates contractions at a 4-5 out of 10 ? ?Ok with cervical check and balloon placement at this time ?Objective: ?BP 140/77   Pulse 83   Temp 98.5 ?F (36.9 ?C) (Oral)   Wt 112 kg   LMP 03/26/2021 (Approximate)   BMI 46.65 kg/m?  ?No intake/output data recorded. ?No intake/output data recorded. ? ?FHT:  FHR: 150 bpm, variability: moderate,  accelerations:  Present,  decelerations:  Absent ?UC:   irregular ?SVE:   Dilation: 1.5 ?Effacement (%): 50 ?Station: -2 ?Exam by:: Ephriam Jenkins, MD ? ?Labs: ?Lab Results  ?Component Value Date  ? WBC 10.8 (H) 11/15/2021  ? HGB 11.1 (L) 11/15/2021  ? HCT 33.8 (L) 11/15/2021  ? MCV 73.0 (L) 11/15/2021  ? PLT 354 11/15/2021  ? ? ?Assessment / Plan: ?[id number] at [redacted]w[redacted]d  admitted for TOLAC/induction of labor due to A2GDM and suspected macrosomia (93%ile) ? ?Labor:  patient's cervix now 1.5 to 2 cm (internal os with external os at more advanced dilation and then tunneling to less dilated internal os.) Cooks balloon placed successfully manually during exam. During inflation of balloon moderate amount of clear fluid noted and therefore suspect SROM/AROM with balloon placement. Discussed TOLAC risks with patient especially with suspected fetal macrosomia and patient desires to continue at this time . Will keep pitocin at 8 while balloon is in and then uptitrate after. ? ?#gHTN ?BP throughout day intermittently >140s or >90s and meeting criteria for gHTN. Labs normal at admission. Will continue to monitor BP and continue with IOL ? ?Fetal Wellbeing:  Category I ?Pain Control:   Plans epidural ?I/D:   GBS negative ? ?#A2GDM ?On Metformin at home. Q4hr BG checks here and all within goal. AFI normal on 3/13. EFW on 2/16 93%ile with AC 99%ile. Discussed shoulder dystocia  risk with patient in detail and discussed having extra support at delivery as well as potential maneuvers. Patient expressed understanding. Will plan accordingly at delivery.  ? ?Warner Mccreedy, MD, MPH ?OB Fellow, Faculty Practice ? ? ?

## 2021-11-16 ENCOUNTER — Encounter (HOSPITAL_COMMUNITY): Payer: Self-pay | Admitting: Obstetrics & Gynecology

## 2021-11-16 ENCOUNTER — Inpatient Hospital Stay (HOSPITAL_COMMUNITY): Payer: Medicaid Other | Admitting: Anesthesiology

## 2021-11-16 ENCOUNTER — Encounter (HOSPITAL_COMMUNITY)
Admission: AD | Disposition: A | Discharge status: Home / Self Care | Payer: Self-pay | Source: Home / Self Care | Attending: Family Medicine

## 2021-11-16 DIAGNOSIS — O411230 Chorioamnionitis, third trimester, not applicable or unspecified: Secondary | ICD-10-CM

## 2021-11-16 DIAGNOSIS — O34211 Maternal care for low transverse scar from previous cesarean delivery: Secondary | ICD-10-CM

## 2021-11-16 DIAGNOSIS — Z3A39 39 weeks gestation of pregnancy: Secondary | ICD-10-CM

## 2021-11-16 DIAGNOSIS — Z302 Encounter for sterilization: Secondary | ICD-10-CM

## 2021-11-16 DIAGNOSIS — O24425 Gestational diabetes mellitus in childbirth, controlled by oral hypoglycemic drugs: Secondary | ICD-10-CM

## 2021-11-16 LAB — CBC WITH DIFFERENTIAL/PLATELET
Abs Immature Granulocytes: 0.05 10*3/uL (ref 0.00–0.07)
Basophils Absolute: 0.1 10*3/uL (ref 0.0–0.1)
Basophils Relative: 0 %
Eosinophils Absolute: 0.1 10*3/uL (ref 0.0–0.5)
Eosinophils Relative: 1 %
HCT: 34.7 % — ABNORMAL LOW (ref 36.0–46.0)
Hemoglobin: 11.3 g/dL — ABNORMAL LOW (ref 12.0–15.0)
Immature Granulocytes: 0 %
Lymphocytes Relative: 20 %
Lymphs Abs: 2.9 10*3/uL (ref 0.7–4.0)
MCH: 23.7 pg — ABNORMAL LOW (ref 26.0–34.0)
MCHC: 32.6 g/dL (ref 30.0–36.0)
MCV: 72.7 fL — ABNORMAL LOW (ref 80.0–100.0)
Monocytes Absolute: 0.8 10*3/uL (ref 0.1–1.0)
Monocytes Relative: 5 %
Neutro Abs: 10.6 10*3/uL — ABNORMAL HIGH (ref 1.7–7.7)
Neutrophils Relative %: 74 %
Platelets: 354 10*3/uL (ref 150–400)
RBC: 4.77 MIL/uL (ref 3.87–5.11)
RDW: 16.8 % — ABNORMAL HIGH (ref 11.5–15.5)
WBC: 14.4 10*3/uL — ABNORMAL HIGH (ref 4.0–10.5)
nRBC: 0 % (ref 0.0–0.2)

## 2021-11-16 LAB — GLUCOSE, CAPILLARY
Glucose-Capillary: 85 mg/dL (ref 70–99)
Glucose-Capillary: 76 mg/dL (ref 70–99)
Glucose-Capillary: 72 mg/dL (ref 70–99)
Glucose-Capillary: 87 mg/dL (ref 70–99)
Glucose-Capillary: 114 mg/dL — ABNORMAL HIGH (ref 70–99)

## 2021-11-16 LAB — RPR: RPR Ser Ql: NONREACTIVE

## 2021-11-16 SURGERY — Surgical Case
Anesthesia: Epidural

## 2021-11-16 MED ORDER — KETOROLAC TROMETHAMINE 30 MG/ML IJ SOLN: 30.0000 mg | Freq: Four times a day (QID) | INTRAMUSCULAR | Status: DC | PRN

## 2021-11-16 MED ORDER — SODIUM CHLORIDE 0.9% FLUSH: 3.0000 mL | INTRAVENOUS | Status: DC | PRN

## 2021-11-16 MED ORDER — LACTATED RINGERS AMNIOINFUSION: INTRAVENOUS | Status: DC

## 2021-11-16 MED ORDER — MEPERIDINE HCL 25 MG/ML IJ SOLN: 6.2500 mg | INTRAMUSCULAR | Status: DC | PRN

## 2021-11-16 MED ORDER — ENOXAPARIN SODIUM 60 MG/0.6ML IJ SOSY
60.0000 mg | PREFILLED_SYRINGE | INTRAMUSCULAR | Status: DC
  Administered 2021-11-17: 60 mg via SUBCUTANEOUS
  Filled 2021-11-16: qty 0.6

## 2021-11-16 MED ORDER — SODIUM CHLORIDE 0.9 % IV SOLN
500.0000 mg | Freq: Once | INTRAVENOUS | Status: AC
  Administered 2021-11-16: 500 mg via INTRAVENOUS

## 2021-11-16 MED ORDER — DIPHENHYDRAMINE HCL 50 MG/ML IJ SOLN: 12.5000 mg | INTRAMUSCULAR | Status: DC | PRN

## 2021-11-16 MED ORDER — IBUPROFEN 600 MG PO TABS
600.0000 mg | ORAL_TABLET | Freq: Four times a day (QID) | ORAL | Status: DC
  Administered 2021-11-17 – 2021-11-18 (×4): 600 mg via ORAL
  Filled 2021-11-16 (×4): qty 1

## 2021-11-16 MED ORDER — MEASLES, MUMPS & RUBELLA VAC IJ SOLR: 0.5000 mL | Freq: Once | INTRAMUSCULAR | Status: DC

## 2021-11-16 MED ORDER — CLINDAMYCIN PHOSPHATE 900 MG/50ML IV SOLN
INTRAVENOUS | Status: AC
  Filled 2021-11-16: qty 50

## 2021-11-16 MED ORDER — SIMETHICONE 80 MG PO CHEW: 80.0000 mg | CHEWABLE_TABLET | ORAL | Status: DC | PRN

## 2021-11-16 MED ORDER — GENTAMICIN SULFATE 40 MG/ML IJ SOLN
5.0000 mg/kg | Freq: Once | INTRAVENOUS | Status: AC
  Administered 2021-11-16: 367.6 mg via INTRAVENOUS
  Filled 2021-11-16: qty 9.25

## 2021-11-16 MED ORDER — NALOXONE HCL 4 MG/10ML IJ SOLN
1.0000 ug/kg/h | INTRAVENOUS | Status: DC | PRN
  Filled 2021-11-16: qty 5

## 2021-11-16 MED ORDER — MORPHINE SULFATE (PF) 0.5 MG/ML IJ SOLN
INTRAMUSCULAR | Status: DC | PRN
  Administered 2021-11-16: 3 mg via EPIDURAL

## 2021-11-16 MED ORDER — CLINDAMYCIN PHOSPHATE 900 MG/50ML IV SOLN
900.0000 mg | Freq: Three times a day (TID) | INTRAVENOUS | Status: AC
  Administered 2021-11-17 (×3): 900 mg via INTRAVENOUS
  Filled 2021-11-16 (×3): qty 50

## 2021-11-16 MED ORDER — KETOROLAC TROMETHAMINE 30 MG/ML IJ SOLN
30.0000 mg | Freq: Four times a day (QID) | INTRAMUSCULAR | Status: AC
  Administered 2021-11-17 (×3): 30 mg via INTRAVENOUS
  Filled 2021-11-16 (×3): qty 1

## 2021-11-16 MED ORDER — LIDOCAINE-EPINEPHRINE (PF) 2 %-1:200000 IJ SOLN
INTRAMUSCULAR | Status: DC | PRN
  Administered 2021-11-16: 5 mL via EPIDURAL
  Administered 2021-11-16: 3 mL via EPIDURAL
  Administered 2021-11-16: 5 mL via EPIDURAL

## 2021-11-16 MED ORDER — SCOPOLAMINE 1 MG/3DAYS TD PT72: 1.0000 | MEDICATED_PATCH | Freq: Once | TRANSDERMAL | Status: DC

## 2021-11-16 MED ORDER — PNEUMOCOCCAL 20-VAL CONJ VACC 0.5 ML IM SUSY
0.5000 mL | PREFILLED_SYRINGE | INTRAMUSCULAR | Status: DC
  Filled 2021-11-16: qty 0.5

## 2021-11-16 MED ORDER — ONDANSETRON HCL 4 MG/2ML IJ SOLN
INTRAMUSCULAR | Status: DC | PRN
  Administered 2021-11-16: 4 mg via INTRAVENOUS

## 2021-11-16 MED ORDER — OXYCODONE HCL 5 MG/5ML PO SOLN: 5.0000 mg | Freq: Once | ORAL | Status: DC | PRN

## 2021-11-16 MED ORDER — SODIUM CHLORIDE 0.9 % IR SOLN
Status: DC | PRN
  Administered 2021-11-16: 1000 mL

## 2021-11-16 MED ORDER — MENTHOL 3 MG MT LOZG: 1.0000 | LOZENGE | OROMUCOSAL | Status: DC | PRN

## 2021-11-16 MED ORDER — STERILE WATER FOR IRRIGATION IR SOLN
Status: DC | PRN
  Administered 2021-11-16: 1000 mL

## 2021-11-16 MED ORDER — KETOROLAC TROMETHAMINE 30 MG/ML IJ SOLN
30.0000 mg | Freq: Four times a day (QID) | INTRAMUSCULAR | Status: DC | PRN
  Administered 2021-11-16: 30 mg via INTRAVENOUS

## 2021-11-16 MED ORDER — LACTATED RINGERS IV SOLN: INTRAVENOUS | Status: DC

## 2021-11-16 MED ORDER — OXYCODONE HCL 5 MG PO TABS
5.0000 mg | ORAL_TABLET | ORAL | Status: DC | PRN
  Administered 2021-11-17 (×2): 5 mg via ORAL
  Administered 2021-11-18 (×2): 10 mg via ORAL
  Filled 2021-11-16 (×2): qty 2
  Filled 2021-11-16 (×2): qty 1

## 2021-11-16 MED ORDER — TRANEXAMIC ACID-NACL 1000-0.7 MG/100ML-% IV SOLN
INTRAVENOUS | Status: AC
  Filled 2021-11-16: qty 100

## 2021-11-16 MED ORDER — DIBUCAINE (PERIANAL) 1 % EX OINT: 1.0000 "application " | TOPICAL_OINTMENT | CUTANEOUS | Status: DC | PRN

## 2021-11-16 MED ORDER — LIDOCAINE HCL (PF) 1 % IJ SOLN
INTRAMUSCULAR | Status: DC | PRN
  Administered 2021-11-16 (×2): 4 mL via EPIDURAL

## 2021-11-16 MED ORDER — DEXAMETHASONE SODIUM PHOSPHATE 10 MG/ML IJ SOLN
INTRAMUSCULAR | Status: AC
  Filled 2021-11-16: qty 1

## 2021-11-16 MED ORDER — METHYLERGONOVINE MALEATE 0.2 MG/ML IJ SOLN
INTRAMUSCULAR | Status: AC
  Filled 2021-11-16: qty 1

## 2021-11-16 MED ORDER — NALOXONE HCL 0.4 MG/ML IJ SOLN: 0.4000 mg | INTRAMUSCULAR | Status: DC | PRN

## 2021-11-16 MED ORDER — SIMETHICONE 80 MG PO CHEW
80.0000 mg | CHEWABLE_TABLET | Freq: Three times a day (TID) | ORAL | Status: DC
  Administered 2021-11-17 – 2021-11-18 (×4): 80 mg via ORAL
  Filled 2021-11-16 (×4): qty 1

## 2021-11-16 MED ORDER — SODIUM CHLORIDE 0.9 % IV SOLN
INTRAVENOUS | Status: AC
  Filled 2021-11-16: qty 5

## 2021-11-16 MED ORDER — DIPHENHYDRAMINE HCL 25 MG PO CAPS: 25.0000 mg | ORAL_CAPSULE | ORAL | Status: DC | PRN

## 2021-11-16 MED ORDER — MORPHINE SULFATE (PF) 0.5 MG/ML IJ SOLN
INTRAMUSCULAR | Status: AC
  Filled 2021-11-16: qty 10

## 2021-11-16 MED ORDER — COCONUT OIL OIL: 1.0000 "application " | TOPICAL_OIL | Status: DC | PRN

## 2021-11-16 MED ORDER — OXYTOCIN-SODIUM CHLORIDE 30-0.9 UT/500ML-% IV SOLN
INTRAVENOUS | Status: AC
  Filled 2021-11-16: qty 500

## 2021-11-16 MED ORDER — WITCH HAZEL-GLYCERIN EX PADS: 1.0000 "application " | MEDICATED_PAD | CUTANEOUS | Status: DC | PRN

## 2021-11-16 MED ORDER — OXYTOCIN-SODIUM CHLORIDE 30-0.9 UT/500ML-% IV SOLN
2.5000 [IU]/h | INTRAVENOUS | Status: AC
  Filled 2021-11-16: qty 500

## 2021-11-16 MED ORDER — MORPHINE SULFATE (PF) 0.5 MG/ML IJ SOLN
INTRAMUSCULAR | Status: DC | PRN
  Administered 2021-11-16: 2 mg via INTRAVENOUS

## 2021-11-16 MED ORDER — HYDROMORPHONE HCL 1 MG/ML IJ SOLN: 0.2500 mg | INTRAMUSCULAR | Status: DC | PRN

## 2021-11-16 MED ORDER — ONDANSETRON HCL 4 MG/2ML IJ SOLN
4.0000 mg | Freq: Three times a day (TID) | INTRAMUSCULAR | Status: DC | PRN
  Administered 2021-11-18: 4 mg via INTRAVENOUS
  Filled 2021-11-16: qty 2

## 2021-11-16 MED ORDER — METHYLERGONOVINE MALEATE 0.2 MG/ML IJ SOLN
INTRAMUSCULAR | Status: DC | PRN
  Administered 2021-11-16: .2 mg via INTRAMUSCULAR

## 2021-11-16 MED ORDER — SENNOSIDES-DOCUSATE SODIUM 8.6-50 MG PO TABS
2.0000 | ORAL_TABLET | Freq: Every day | ORAL | Status: DC
  Administered 2021-11-18: 2 via ORAL

## 2021-11-16 MED ORDER — FENTANYL CITRATE (PF) 100 MCG/2ML IJ SOLN
INTRAMUSCULAR | Status: DC | PRN
  Administered 2021-11-16: 50 ug via INTRAVENOUS

## 2021-11-16 MED ORDER — FENTANYL CITRATE (PF) 100 MCG/2ML IJ SOLN
INTRAMUSCULAR | Status: AC
  Filled 2021-11-16: qty 2

## 2021-11-16 MED ORDER — TRANEXAMIC ACID-NACL 1000-0.7 MG/100ML-% IV SOLN
INTRAVENOUS | Status: DC | PRN
  Administered 2021-11-16: 1000 mg via INTRAVENOUS

## 2021-11-16 MED ORDER — PROCHLORPERAZINE EDISYLATE 10 MG/2ML IJ SOLN
10.0000 mg | Freq: Once | INTRAMUSCULAR | Status: DC | PRN
  Filled 2021-11-16: qty 2

## 2021-11-16 MED ORDER — OXYCODONE HCL 5 MG PO TABS: 5.0000 mg | ORAL_TABLET | Freq: Once | ORAL | Status: DC | PRN

## 2021-11-16 MED ORDER — ACETAMINOPHEN 10 MG/ML IV SOLN: 1000.0000 mg | Freq: Once | INTRAVENOUS | Status: DC | PRN

## 2021-11-16 MED ORDER — KETOROLAC TROMETHAMINE 30 MG/ML IJ SOLN
INTRAMUSCULAR | Status: AC
  Filled 2021-11-16: qty 1

## 2021-11-16 MED ORDER — CLINDAMYCIN PHOSPHATE 900 MG/50ML IV SOLN
900.0000 mg | Freq: Once | INTRAVENOUS | Status: AC
  Administered 2021-11-16: 900 mg via INTRAVENOUS

## 2021-11-16 MED ORDER — PRENATAL MULTIVITAMIN CH
1.0000 | ORAL_TABLET | Freq: Every day | ORAL | Status: DC
  Administered 2021-11-17 – 2021-11-18 (×2): 1 via ORAL
  Filled 2021-11-16 (×2): qty 1

## 2021-11-16 MED ORDER — DIPHENHYDRAMINE HCL 25 MG PO CAPS: 25.0000 mg | ORAL_CAPSULE | Freq: Four times a day (QID) | ORAL | Status: DC | PRN

## 2021-11-16 SURGICAL SUPPLY — 27 items
BENZOIN TINCTURE PRP APPL 2/3 (GAUZE/BANDAGES/DRESSINGS) ×2 IMPLANT
CHLORAPREP W/TINT 26ML (MISCELLANEOUS) ×2 IMPLANT
CLOTH BEACON ORANGE TIMEOUT ST (SAFETY) ×2 IMPLANT
DERMABOND ADVANCED (GAUZE/BANDAGES/DRESSINGS) ×1
DERMABOND ADVANCED .7 DNX12 (GAUZE/BANDAGES/DRESSINGS) IMPLANT
DRSG OPSITE POSTOP 4X10 (GAUZE/BANDAGES/DRESSINGS) ×2 IMPLANT
ELECT REM PT RETURN 9FT ADLT (ELECTROSURGICAL) ×2
ELECTRODE REM PT RTRN 9FT ADLT (ELECTROSURGICAL) ×1 IMPLANT
GLOVE BIOGEL PI IND STRL 7.0 (GLOVE) ×3 IMPLANT
GLOVE BIOGEL PI INDICATOR 7.0 (GLOVE) ×3
GLOVE ECLIPSE 6.5 STRL STRAW (GLOVE) ×2 IMPLANT
GOWN STRL REUS W/ TWL LRG LVL3 (GOWN DISPOSABLE) ×2 IMPLANT
GOWN STRL REUS W/TWL LRG LVL3 (GOWN DISPOSABLE) ×2
NS IRRIG 1000ML POUR BTL (IV SOLUTION) ×2 IMPLANT
PAD OB MATERNITY 4.3X12.25 (PERSONAL CARE ITEMS) ×2 IMPLANT
PAD PREP 24X48 CUFFED NSTRL (MISCELLANEOUS) ×2 IMPLANT
RETRACTOR WND ALEXIS 25 LRG (MISCELLANEOUS) IMPLANT
RTRCTR WOUND ALEXIS 25CM LRG (MISCELLANEOUS)
STRIP CLOSURE SKIN 1/2X4 (GAUZE/BANDAGES/DRESSINGS) ×2 IMPLANT
SUT PLAIN 2 0 XLH (SUTURE) ×2 IMPLANT
SUT VIC AB 0 CT1 36 (SUTURE) ×4 IMPLANT
SUT VIC AB 2-0 CT1 27 (SUTURE) ×1
SUT VIC AB 2-0 CT1 TAPERPNT 27 (SUTURE) ×1 IMPLANT
SUT VIC AB 4-0 KS 27 (SUTURE) ×2 IMPLANT
TOWEL OR 17X24 6PK STRL BLUE (TOWEL DISPOSABLE) ×6 IMPLANT
TRAY FOLEY CATH SILVER 16FR (SET/KITS/TRAYS/PACK) ×2 IMPLANT
WATER STERILE IRR 1000ML POUR (IV SOLUTION) ×2 IMPLANT

## 2021-11-16 NOTE — Progress Notes (Addendum)
Labor Progress Note ?Shannon Diaz is a 31 y.o. [id number] at [redacted]w[redacted]d who presented for IOL/TOLAC due to A2GDM and suspected macrosomia.  ? ?S: Doing well, no concerns.  ? ?O:  ?BP 138/82   Pulse (!) 101   Temp 98.2 ?F (36.8 ?C) (Oral)   Resp 16   Wt 112 kg   LMP 03/26/2021 (Approximate)   SpO2 97%   BMI 46.65 kg/m?  ? ?EFM: Baseline 145 bpm, moderate variability, + accels, variable decels  ?Toco: Every 2-3 minutes, contraction pattern adequate ~200 MVUs ? ?CVE: Dilation: 4.5 ?Effacement (%): 80 ?Station: -3 ?Presentation: Vertex ?Exam by:: Mathis Fare ? ? ?A&P: 31 y.o. [id number] [redacted]w[redacted]d  ? ?#Labor: SVE largely unchanged, though more effaced. Contractions now appear to be adequate. FSE recommended due to inability to trace FHT with contractions. Patient verbally consented, FSE placed without difficulty. Will continue to monitor and maintain Pitocin at current rate unless contractions space. Plan to reassess in 4 hours after adequate contractions. If no change, discussed possibility of cesarean section. Patient voiced understanding, all questions and concerns addressed.  ?#Pain: Epidural  ?#FWB: Cat 2 due to variable decels. FSE placed. Some appear to be early decels vs variable decels. Amnioinfusion ongoing. Will continue to monitor closely.  ?#GBS negative ? ?#A2GDM: CBGs stable. Will continue to check every 4 hours.  ? ?Worthy Rancher, MD ?12:52 PM ? ?

## 2021-11-16 NOTE — Progress Notes (Signed)
Reviewed clinical course in detail. Patient has met criteria for arrest of dilation with development of Triple I and worsening fetal tracing (Category 2 with deep variables). Additionally patient is a TOLAC with a EFW > 4100g.  ? ?Clindamycin and Gentamycin were ordered for Triple I ? ?The risks of cesarean section were discussed with the patient including but were not limited to: bleeding which may require transfusion or reoperation; infection which may require antibiotics; injury to bowel, bladder, ureters or other surrounding organs; injury to the fetus; need for additional procedures including hysterectomy in the event of a life-threatening hemorrhage; placental abnormalities wth subsequent pregnancies, incisional problems, thromboembolic phenomenon and other postoperative/anesthesia complications.  Patient also desires permanent sterilization.  Other reversible forms of contraception were discussed with patient; she declines all other modalities. Risks of procedure discussed with patient including but not limited to: risk of regret, permanence of method, bleeding, infection, injury to surrounding organs and need for additional procedures.  Failure risk of 1-2% with increased risk of ectopic gestation if pregnancy occurs was also discussed with patient.  The patient concurred with the proposed plan, giving informed written consent for the procedures.  Patient has been NPO since admission she will remain NPO for procedure. Anesthesia and OR aware.  Preoperative prophylactic antibiotics and SCDs ordered on call to the OR.  To OR when ready. ? ?Caren Macadam, MD, MPH, ABFM, IBCLC ?Attending Physician ?[facility] ? ?

## 2021-11-16 NOTE — Anesthesia Preprocedure Evaluation (Addendum)
Anesthesia Evaluation  ?Patient identified by MRN, date of birth, ID band ?Patient awake ? ? ? ?Reviewed: ?Allergy & Precautions, Patient's Chart, lab work & pertinent test results ? ?Airway ?Mallampati: II ? ?TM Distance: >3 FB ? ? ? ? Dental ?no notable dental hx. ? ?  ?Pulmonary ?neg pulmonary ROS,  ?  ?Pulmonary exam normal ? ? ? ? ? ? ? Cardiovascular ?hypertension, Normal cardiovascular exam ? ? ?  ?Neuro/Psych ?negative neurological ROS ? negative psych ROS  ? GI/Hepatic ?Neg liver ROS, GERD  ,  ?Endo/Other  ?diabetes, Well Controlled, Gestational, Oral Hypoglycemic Agents ? Renal/GU ?negative Renal ROS  ?negative genitourinary ?  ?Musculoskeletal ?negative musculoskeletal ROS ?(+)  ? Abdominal ?(+) + obese,   ?Peds ? Hematology ? ?(+) Blood dyscrasia, Sickle cell trait and anemia ,   ?Anesthesia Other Findings ? ? Reproductive/Obstetrics ?(+) Pregnancy ?Pre Eclampsia ?GDM ?Late PNC ? ?  ? ? ? ? ? ? ? ? ? ? ? ? ? ?  ?  ? ? ? ? ? ? ? ? ?Anesthesia Physical ?Anesthesia Plan ? ?ASA: 3 ? ?Anesthesia Plan: Epidural  ? ?Post-op Pain Management:   ? ?Induction:  ? ?PONV Risk Score and Plan:  ? ?Airway Management Planned: Natural Airway ? ?Additional Equipment:  ? ?Intra-op Plan:  ? ?Post-operative Plan:  ? ?Informed Consent: I have reviewed the patients History and Physical, chart, labs and discussed the procedure including the risks, benefits and alternatives for the proposed anesthesia with the patient or authorized representative who has indicated his/her understanding and acceptance.  ? ? ? ? ? ?Plan Discussed with: Anesthesiologist ? ?Anesthesia Plan Comments: (UPDATE 11/16/21 5:59 PM: Patient to go to OR for C section due to arrest of dilation and previous C section. Labor epidural in place and functioning well. Will plan to use epidural for surgical anesthesia. Discussed with patient. )  ? ? ? ? ? ?Anesthesia Quick Evaluation ? ?

## 2021-11-16 NOTE — Anesthesia Procedure Notes (Signed)
Epidural ?Patient location during procedure: OB ?Start time: 11/16/2021 3:28 AM ?End time: 11/16/2021 3:36 AM ? ?Staffing ?Anesthesiologist: Josephine Igo, MD ?Performed: anesthesiologist  ? ?Preanesthetic Checklist ?Completed: patient identified, IV checked, site marked, risks and benefits discussed, surgical consent, monitors and equipment checked, pre-op evaluation and timeout performed ? ?Epidural ?Patient position: sitting ?Prep: DuraPrep and site prepped and draped ?Patient monitoring: continuous pulse ox and blood pressure ?Approach: midline ?Location: L3-L4 ?Injection technique: LOR air ? ?Needle:  ?Needle type: Tuohy  ?Needle gauge: 17 G ?Needle length: 9 cm and 9 ?Needle insertion depth: 5 cm ?Catheter type: closed end flexible ?Catheter size: 19 Gauge ?Catheter at skin depth: 10 cm ?Test dose: negative and Other ? ?Assessment ?Events: blood not aspirated, injection not painful, no injection resistance, no paresthesia and negative IV test ? ?Additional Notes ?Patient identified. Risks and benefits discussed including failed block, incomplete  ?Pain control, post dural puncture headache, nerve damage, paralysis, blood pressure ?Changes, nausea, vomiting, reactions to medications-both toxic and allergic and post ?Partum back pain. All questions were answered. Patient expressed understanding and wished to proceed. Sterile technique was used throughout procedure. Epidural site was ?Dressed with sterile barrier dressing. No paresthesias, signs of intravascular injection ?Or signs of intrathecal spread were encountered.  ?Patient was more comfortable after the epidural was dosed. ?Please see RN's note for documentation of vital signs and FHR which are stable. ?Reason for block:procedure for pain ? ? ? ?

## 2021-11-16 NOTE — Anesthesia Postprocedure Evaluation (Signed)
Anesthesia Post Note ? ?Patient: Shannon Diaz ? ?Procedure(s) Performed: CESAREAN SECTION ? ?  ? ?Patient location during evaluation: PACU ?Anesthesia Type: Epidural ?Level of consciousness: awake and alert ?Pain management: pain level controlled ?Vital Signs Assessment: post-procedure vital signs reviewed and stable ?Respiratory status: spontaneous breathing, nonlabored ventilation and respiratory function stable ?Cardiovascular status: blood pressure returned to baseline and tachycardic ?Postop Assessment: epidural receding, no apparent nausea or vomiting, no headache and no backache ?Anesthetic complications: no ? ? ?No notable events documented. ? ?Last Vitals:  ?Vitals:  ? 11/16/21 2000 11/16/21 2015  ?BP: 124/63 116/73  ?Pulse: (!) 116 (!) 116  ?Resp: 13 17  ?Temp:  37.5 ?C  ?SpO2: 97% 96%  ?  ?Last Pain:  ?Vitals:  ? 11/16/21 2000  ?TempSrc:   ?PainSc: 0-No pain  ? ?Pain Goal:   ? ?LLE Motor Response: Purposeful movement (11/16/21 1945) ?LLE Sensation: Tingling (11/16/21 1945) ?RLE Motor Response: Purposeful movement (11/16/21 1945) ?RLE Sensation: Tingling (11/16/21 1945) ?  ?  ?Epidural/Spinal Function Cutaneous sensation: Able to Wiggle Toes (11/16/21 2000), Patient able to flex knees: Yes (11/16/21 2000), Patient able to lift hips off bed: No (11/16/21 2000), Back pain beyond tenderness at insertion site: No (11/16/21 2000), Progressively worsening motor and/or sensory loss: No (11/16/21 2000), Bowel and/or bladder incontinence post epidural: No (11/16/21 2000) ? ?[name] ? ? ? ? ?

## 2021-11-16 NOTE — Progress Notes (Signed)
Labor Progress Note ?Shannon Diaz is a 31 y.o. [id number] at [redacted]w[redacted]d who presented for IOL/TOLAC due to A2GDM and suspected macrosomia.  ? ?S: Doing well. No concerns at this time.  ? ?O:  ?BP 128/89   Pulse 83   Temp 98 ?F (36.7 ?C) (Oral)   Resp 16   Wt 112 kg   LMP 03/26/2021 (Approximate)   SpO2 97%   BMI 46.65 kg/m?  ? ?EFM: Baseline 135 bpm, moderate variability, + accels, variable decels  ?Toco: Contractions not tracing externally, IUPC placed, contraction pattern inadequate per MVUs ? ?CVE: Dilation: 4.5 ?Effacement (%): 50 ?Station: -3 ?Presentation: Vertex ?Exam by:: Marycatherine Maniscalco ? ? ?A&P: 31 y.o. [id number] [redacted]w[redacted]d  ? ?#Labor: SVE unchanged. Fetal station -3 and not engaged well in pelvis. IUPC placed without difficulty to assist with tracing contractions. Pattern inadequate per MVUs. Will continue to titrate Pitocin as tolerated and reassess in 4 hours.  ?#Pain: Epidural  ?#FWB: Cat 2 due to variable decelerations. Continues to have reassuring variability and accels. Improved with position change. Will continue to monitor closely.  ?#GBS negative ? ?#A2GDM: CBGs remain at goal < 100. Will continue to monitor every 4 hours for now.  ? ?Worthy Rancher, MD ?9:59 AM ? ?

## 2021-11-16 NOTE — Progress Notes (Signed)
Shannon Diaz is a 31 y.o. [id number] at [redacted]w[redacted]d  admitted for TOLAC/induction of labor due to A2GDM and suspected macrosomia (93%ile) ? ?Subjective: ?Resting comfortably s/p epidural  ? ?Objective: ?BP 115/60   Pulse 82   Temp 98.2 ?F (36.8 ?C) (Oral)   Resp 18   Wt 112 kg   LMP 03/26/2021 (Approximate)   SpO2 97%   BMI 46.65 kg/m?  ?No intake/output data recorded. ?No intake/output data recorded. ? ?FHT:  FHR: 140 bpm, variability: moderate,  accelerations:  Present,  decelerations:  one variable ?SVE:   Dilation: 4.5 ?Effacement (%): 70 ?Station: -2 ?Exam by:: Shannon Ruder, RN ? ?Labs: ?Lab Results  ?Component Value Date  ? WBC 14.4 (H) 11/16/2021  ? HGB 11.3 (L) 11/16/2021  ? HCT 34.7 (L) 11/16/2021  ? MCV 72.7 (L) 11/16/2021  ? PLT 354 11/16/2021  ? ? ?Assessment / Plan: ?[id number] at [redacted]w[redacted]d  admitted for TOLAC/induction of labor due to A2GDM and suspected macrosomia (93%ile) ? ?Labor: FB still in place. Will keep pitocin at 8 while balloon is in and then uptitrate after. ? ?#gHTN ?BP throughout day intermittently >140s or >90s and meeting criteria for gHTN. BP normotensive. Currently asymptomatic, denies headache/vision changes ?-continue to monitor BP and continue with IOL ? ?Fetal Wellbeing:  Category I ?Pain Control:  Epidural in place ?I/D:   GBS negative ? ?#A2GDM ?On Metformin at home. Q4hr BG checks here and all within goal (80s). AFI normal on 3/13. EFW on 2/16 93%ile with AC 99%ile ?-monitor CBGs ? ?Shannon Diaz ?FM PGY-1 ? ? ?

## 2021-11-16 NOTE — Progress Notes (Signed)
Variable decels noted with contractions despite position changes. Will start amnioinfusion with 300 cc bolus and 150 cc/hr maintenance rate. Will continue to monitor and reassess response.  ? ?-Evalina Field, MD  ?

## 2021-11-16 NOTE — Progress Notes (Signed)
Shannon Diaz is a 31 y.o. [id number] at [redacted]w[redacted]d  admitted for TOLAC/induction of labor due to A2GDM and suspected macrosomia (93%ile) ? ?Subjective: ?Rates contractions 7-8/10 and feeling some more pain with contractions ? ?Objective: ?BP (!) 149/80   Pulse 76   Temp 97.8 ?F (36.6 ?C) (Oral)   Wt 112 kg   LMP 03/26/2021 (Approximate)   BMI 46.65 kg/m?  ?No intake/output data recorded. ?No intake/output data recorded. ? ?FHT:  FHR: 140 bpm, variability: moderate,  accelerations:  Present,  decelerations:  some early's ?SVE:   Dilation: 1.5 ?Effacement (%): 50 ?Station: -2 ?Exam by:: Ephriam Jenkins, MD ? ?Labs: ?Lab Results  ?Component Value Date  ? WBC 10.8 (H) 11/15/2021  ? HGB 11.1 (L) 11/15/2021  ? HCT 33.8 (L) 11/15/2021  ? MCV 73.0 (L) 11/15/2021  ? PLT 354 11/15/2021  ? ? ?Assessment / Plan: ?[id number] at [redacted]w[redacted]d  admitted for TOLAC/induction of labor due to A2GDM and suspected macrosomia (93%ile) ? ?Labor: FB still in place. Will keep pitocin at 8 while balloon is in and then uptitrate after. ? ?#gHTN ?BP throughout day intermittently >140s or >90s and meeting criteria for gHTN. Labs normal at admission. Currently asymptomatic  ?-continue to monitor BP and continue with IOL ? ?Fetal Wellbeing:  Category I ?Pain Control:   Plans epidural , receiving PRN fentanyl ?I/D:   GBS negative ? ?#A2GDM ?On Metformin at home. Q4hr BG checks here and all within goal. AFI normal on 3/13. EFW on 2/16 93%ile with AC 99%ile.  ?-monitor CBGs ? ?Arrington Bencomo ?FM PGY-1 ? ? ?

## 2021-11-16 NOTE — Op Note (Addendum)
Shannon Diaz ? ?PROCEDURE DATE: 11/16/2021 ? ?PREOPERATIVE DIAGNOSES: Intrauterine pregnancy at [redacted]w[redacted]d weeks gestation; failure to progress: arrest of dilation; chorioamnionitis; history of cesarean section; undesired fertility  ? ?POSTOPERATIVE DIAGNOSES: The same ? ?PROCEDURE: Repeat Low Transverse Cesarean Section with Bilateral Salpingectomy ? ?SURGEON:  Dr. Duane Lope ? ?ASSISTANT:  Dr. Evalina Field  ? ?ANESTHESIOLOGY TEAM: Anesthesiologist: Mal Amabile, MD; Kaylyn Layer, MD; Lucretia Kern, MD ?CRNA: Elgie Congo, CRNA ? ?INDICATIONS: Shannon Diaz is a 31 y.o. [phone] at [redacted]w[redacted]d here for cesarean section secondary to the indications listed under preoperative diagnoses; please see preoperative note for further details.  The risks of cesarean section were discussed with the patient including but were not limited to: bleeding which may require transfusion or reoperation; infection which may require antibiotics; injury to bowel, bladder, ureters or other surrounding organs; injury to the fetus; need for additional procedures including hysterectomy in the event of a life-threatening hemorrhage; placental abnormalities wth subsequent pregnancies, incisional problems, thromboembolic phenomenon and other postoperative/anesthesia complications.   The patient concurred with the proposed plan, giving informed written consent for the procedure.   ? ?FINDINGS:  Viable female infant in cephalic presentation.  Apgars pending.  Clear amniotic fluid.  Intact placenta, three vessel cord.  Normal uterus, fallopian tubes and ovaries bilaterally.  Anterior omental adhesion present, otherwise minimal adhesive disease overall.  ? ?ANESTHESIA: Epidural  ?INTRAVENOUS FLUIDS: 2000 ml   ?ESTIMATED BLOOD LOSS: 353 ml ?URINE OUTPUT:  150 ml ?SPECIMENS: Placenta sent to L&D, bilateral fallopian tubes sent to pathology  ?COMPLICATIONS: None immediate ? ?PROCEDURE IN DETAIL:   ?The patient preoperatively  received intravenous antibiotics and had sequential compression devices applied to her lower extremities.  She was then taken to the operating room where the epidural anesthesia was dosed up to surgical level and was found to be adequate. She was then placed in a dorsal supine position with a leftward tilt, and prepped and draped in a sterile manner.  A foley catheter was in place.   ? ?After an adequate timeout was performed, a Pfannenstiel skin incision was made with scalpel on her preexisting scar and carried through to the underlying layer of fascia. The fascia was incised in the midline, and this incision was extended bilaterally bluntly.  The rectus muscles were separated in the midline and the peritoneum was entered bluntly.  A bladder blade was placed.  An omental adhesion was noted attaching to anterior abdominal wall. This was clamped and ligated with electrocautery. ? ?Attention was then turned to the lower uterine segment where a low transverse hysterotomy was made with a scalpel and extended bilaterally bluntly.  Difficulty encounter when delivering infant's head through the hysterotomy. A vacuum was used to assist with delivery. After one pull without pop off, the infant was successfully delivered with spontaneous cry, the cord was clamped and cut after one minute, and the infant was handed over to the awaiting neonatology team. Uterine massage was then administered, and the placenta delivered intact with a three-vessel cord. The uterus was then cleared of clots and debris and exteriorized.   ? ?The hysterotomy was closed with 0 Monocryl in a running locked fashion.  Hemostasis was confirmed on all surfaces.   ? ?Attention was then turned to the left fallopian tube. Kelly forceps were placed on the mesosalpinx underneath most of the tube.  This pedicle was suture ligated with Chromic suture and the tube including the fimbriated end was excised.  The right fallopian tube  was then identified, ligated,  and was excised in a similar fashion allowing for bilateral tubal sterilization via bilateral salpingectomy.  Good hemostasis was noted overall. ? ?The uterus was returned to the abdomen.  The pelvis was cleared of all clots and debris.  The ligated ends of the omental adhesion were tied with 0 Chromic with good hemostasis. The retractor was removed.   ? ?The fascia was then closed using 0 Vicryl in a running fashion.  The subcutaneous layer was irrigated, re-approximated with a 2-0 plain gut running stitch, and the skin was closed with a 4-0 Vicryl subcuticular stitch. The patient tolerated the procedure well. Sponge, instrument and needle counts were correct x 3.  She was taken to the recovery room in stable condition.  ? ?Evalina Field, MD  ?[facility] Fellow  Faculty Practice  ? ? ?

## 2021-11-16 NOTE — Discharge Summary (Addendum)
? ?  Postpartum Discharge Summary ? ?   ?Patient Name: Shannon Diaz ?DOB: 05-26-1991 ?MRN: [id number] ? ?Date of admission: 11/15/2021 ?Delivery date:11/16/2021  ?Delivering provider: [name]  ?Date of discharge: 11/18/2021 ? ?Admitting diagnosis: Gestational diabetes mellitus (GDM) controlled on oral hypoglycemic drug, antepartum [O24.415] ?Intrauterine pregnancy: [redacted]w[redacted]d    ?Secondary diagnosis:  Principal Problem: ?  Gestational diabetes mellitus (GDM) controlled on oral hypoglycemic drug, antepartum ?Active Problems: ?  Supervision of high risk pregnancy, antepartum ?  History of cesarean delivery ?  Late prenatal care ?  Gestational HTN ? ?Additional problems: None    ?Discharge diagnosis: Term Pregnancy Delivered                                              ?Post partum procedures: None ?Augmentation: AROM, Pitocin, and IP Foley ?Complications: Intrauterine Inflammation or infection (Chorioamniotis) ? ?Hospital course: Induction of Labor With Cesarean Section   ?31y.o. yo [id number] admitted to the hospital 11/15/2021 for induction of labor/TOLAC. Patient had a Cook's catheter placed, AROM, and Pitocin for augmentation. She dilated to 4.5 without further progress despite adequate contractions for 4 hours and developed triple I. The patient went for cesarean section due to Arrest of Dilation. Delivery details are as follows: ?Membrane Rupture Time/Date: 9:08 PM ,11/15/2021   ?Delivery Method:C-Section, Vacuum Assisted  ?Details of operation can be found in separate operative note.  She received Gentamycin and Clindamycin for 24 hours post-operatively due to triple I in labor.  Her hemoglobin on POD#1 was 9.5, for which she received PO iron. Due to her gHTN she was started on a 5 day course of Lasix. Additionally patient had more than three blood pressures that were >130 in the post partum period and therefore was started on Procardia 31m She is ambulating, tolerating a regular diet,  passing flatus, and urinating well.  Patient is discharged home in stable condition on 11/18/21.     ? ?Newborn Data: ?Birth date:11/16/2021  ?Birth time:5:33 PM  ?Gender:Female  ?Living status:Living  ?Apgars:8 ,9  ?Weight:3380 g                               ? ?Magnesium Sulfate received: No ?BMZ received: No ?Rhophylac: N/A ?MMR: N/A ?T-DaP: Given prenatally ?Flu: Given prenatally  ?Transfusion: No  ? ?Physical exam  ?Vitals:  ? 11/17/21 1555 11/17/21 2124 11/18/21 0540 11/18/21 0746  ?BP: 132/70 132/77 (!) 151/86 129/72  ?Pulse: 100 (!) 115 (!) 134 100  ?Resp: 18  18   ?Temp: 98 ?F (36.7 ?C) 97.8 ?F (36.6 ?C) 99.6 ?F (37.6 ?C)   ?TempSrc: Oral Oral Oral   ?SpO2: 98%  96% 95%  ?Weight:      ? ?General: no distress ?Lochia: appropriate ?Uterine Fundus: firm ?Incision: Dressing is clean, dry, and intact ?DVT Evaluation: No evidence of DVT seen on physical exam. ? ?Labs: ?Lab Results  ?Component Value Date  ? WBC 19.1 (H) 11/17/2021  ? HGB 9.5 (L) 11/17/2021  ? HCT 28.4 (L) 11/17/2021  ? MCV 72.3 (L) 11/17/2021  ? PLT 292 11/17/2021  ? ?CMP Latest Ref Rng & Units 11/15/2021  ?Glucose 70 - 99 mg/dL 76  ?BUN 6 - 20 mg/dL 8  ?Creatinine 0.44 - 1.00 mg/dL 0.73  ?Sodium 135 -  145 mmol/L 135  ?Potassium 3.5 - 5.1 mmol/L 4.1  ?Chloride 98 - 111 mmol/L 106  ?CO2 22 - 32 mmol/L 20(L)  ?Calcium 8.9 - 10.3 mg/dL 8.5(L)  ?Total Protein 6.5 - 8.1 g/dL 7.0  ?Total Bilirubin 0.3 - 1.2 mg/dL 0.2(L)  ?Alkaline Phos 38 - 126 U/L 120  ?AST 15 - 41 U/L 20  ?ALT 0 - 44 U/L 18  ? ?Edinburgh Score: ?Edinburgh Postnatal Depression Scale Screening Tool 11/17/2021  ?I have been able to laugh and see the funny side of things. 0  ?I have looked forward with enjoyment to things. 0  ?I have blamed myself unnecessarily when things went wrong. 0  ?I have been anxious or worried for no good reason. 0  ?I have felt scared or panicky for no good reason. 0  ?Things have been getting on top of me. 0  ?I have been so unhappy that I have had difficulty  sleeping. 0  ?I have felt sad or miserable. 0  ?I have been so unhappy that I have been crying. 0  ?The thought of harming myself has occurred to me. 0  ?Edinburgh Postnatal Depression Scale Total 0  ? ? ? ?After visit meds:  ?Allergies as of 11/18/2021   ? ?   Reactions  ? Penicillins Swelling, Rash  ? Skin gets red  ? ?  ? ?  ?Medication List  ?  ? ?STOP taking these medications   ? ?Accu-Chek Guide test strip ?Generic drug: glucose blood ?  ?Accu-Chek Softclix Lancets lancets ?  ?aspirin EC 81 MG tablet ?  ?blood glucose meter kit and supplies ?  ?metFORMIN 500 MG tablet ?Commonly known as: GLUCOPHAGE ?  ? ?  ? ?TAKE these medications   ? ?ferrous sulfate 325 (65 FE) MG tablet ?Take 1 tablet (325 mg total) by mouth every other day. ?Start taking on: November 19, 2021 ?  ?furosemide 20 MG tablet ?Commonly known as: LASIX ?Take 1 tablet (20 mg total) by mouth daily for 3 days. ?  ?ibuprofen 600 MG tablet ?Commonly known as: ADVIL ?Take 1 tablet (600 mg total) by mouth every 6 (six) hours. ?  ?NIFEdipine 30 MG 24 hr tablet ?Commonly known as: ADALAT CC ?Take 1 tablet (30 mg total) by mouth daily. ?  ?oxyCODONE 5 MG immediate release tablet ?Commonly known as: Oxy IR/ROXICODONE ?Take 1 tablet (5 mg total) by mouth every 6 (six) hours as needed for severe pain. ?  ?prenatal vitamin w/FE, FA 27-1 MG Tabs tablet ?Take 1 tablet by mouth daily at 12 noon. ?  ? ?  ? ? ? ?Discharge home in stable condition ?Infant Feeding: Bottle ?Infant Disposition: home with mother ?Discharge instruction: per After Visit Summary and Postpartum booklet. ?Activity: Advance as tolerated. Pelvic rest for 6 weeks.  ?Diet: routine diet ?Future Appointments: ?Future Appointments  ?Date Time Provider [place]  ?12/24/2021 11:50 AM Roma Schanz, CNM [facility]  ? ?Follow up Visit: ?Message sent to [facility] by Dr. Gwenlyn Perking on 11/16/21.  ? ?Please schedule this patient for a In person postpartum visit in 6 weeks with the following  provider: Any provider. ?Additional Postpartum F/U: 2 hour GTT and Incision check 1 week and BP check 1 wk ?High risk pregnancy complicated by: GDM and history of cesarean section ?Delivery mode:  C-Section, Vacuum Assisted  ?Anticipated Birth Control:  BTL done PP ? ?Renard Matter, MD, MPH ?OB Fellow, Faculty Practice ? ? ? ? ?

## 2021-11-16 NOTE — Transfer of Care (Signed)
Immediate Anesthesia Transfer of Care Note ? ?Patient: Shannon Diaz ? ?Procedure(s) Performed: CESAREAN SECTION ? ?Patient Location: PACU ? ?Anesthesia Type:Epidural ? ?Level of Consciousness: awake ? ?Airway & Oxygen Therapy: Patient Spontanous Breathing ? ?Post-op Assessment: Report given to RN and Post -op Vital signs reviewed and stable ? ?Post vital signs: Reviewed and stable ? ?Last Vitals:  ?Vitals Value Taken Time  ?BP 134/78 11/16/21 1835  ?Temp    ?Pulse 152 11/16/21 1840  ?Resp 20 11/16/21 1840  ?SpO2 97 % 11/16/21 1840  ?Vitals shown include unvalidated device data. ? ?Last Pain:  ?Vitals:  ? 11/16/21 1614  ?TempSrc: Oral  ?PainSc:   ?   ? ?  ? ?Complications: No notable events documented. ?

## 2021-11-17 ENCOUNTER — Encounter (HOSPITAL_COMMUNITY): Payer: Self-pay | Admitting: Family Medicine

## 2021-11-17 LAB — CBC
HCT: 28.4 % — ABNORMAL LOW (ref 36.0–46.0)
Hemoglobin: 9.5 g/dL — ABNORMAL LOW (ref 12.0–15.0)
MCH: 24.2 pg — ABNORMAL LOW (ref 26.0–34.0)
MCHC: 33.5 g/dL (ref 30.0–36.0)
MCV: 72.3 fL — ABNORMAL LOW (ref 80.0–100.0)
Platelets: 292 10*3/uL (ref 150–400)
RBC: 3.93 MIL/uL (ref 3.87–5.11)
RDW: 16.7 % — ABNORMAL HIGH (ref 11.5–15.5)
WBC: 19.1 10*3/uL — ABNORMAL HIGH (ref 4.0–10.5)
nRBC: 0 % (ref 0.0–0.2)

## 2021-11-17 MED ORDER — FUROSEMIDE 20 MG PO TABS
20.0000 mg | ORAL_TABLET | Freq: Every day | ORAL | Status: DC
  Administered 2021-11-17 – 2021-11-18 (×2): 20 mg via ORAL
  Filled 2021-11-17 (×2): qty 1

## 2021-11-17 MED ORDER — FERROUS SULFATE 325 (65 FE) MG PO TABS
325.0000 mg | ORAL_TABLET | ORAL | Status: DC
Start: 2021-11-17 — End: 2021-11-18
  Administered 2021-11-17: 325 mg via ORAL
  Filled 2021-11-17: qty 1

## 2021-11-17 NOTE — Progress Notes (Signed)
Entered pts room for fundal rub and VS check. Asked pt about orthostatics but she was feeding baby and asked to wait. Rounded an hour later and pt was sleeping soundly. When rounding shortly after 0600, pt was holding baby STS after bath. Orthostatics deferred until after post bath 1 hour of required STS.  ?

## 2021-11-17 NOTE — Lactation Note (Signed)
This note was copied from a baby's chart. ?Lactation Consultation Note ? ?Patient Name: Shannon Diaz ?Today's Date: 11/17/2021 ?Reason for consult: Initial assessment;Mother's request;Term, see mom's MR- GDM, C/S delivery. ?Age:31 hours, term female infant. ?Mom's feeding choice is breast and formula feeding. ?Mom requested LC services tonight. ?Per mom, she hasn't latch infant at breast yet mostly been formula feeding infant. ?Mom requested LC services but LC unable to assist with latch, due infant having 15 mls of formula 100 pm and infant is not cuing to breastfeed at this time. ?Mom feels she doesn't have milk, LC used breast model to explain hand expression and mom self expressed a few drops of colostrum.  ?Mom will ask RN/LC to assist with latching infant at the breast for the next feeding before mom supplement's infant with formula. ?Mom knows to breastfeed infant according to hunger cues, 8 to 12+ or more times within 24 hours, skin to skin. ?Mom made aware of O/P services, breastfeeding support groups, community resources, and our phone # for post-discharge questions.   ?Maternal Data ?Has patient been taught Hand Expression?: Yes ?Does the patient have breastfeeding experience prior to this delivery?: Yes ?How long did the patient breastfeed?: Per mom, she BF 1st child for 1 month and 2nd child who is 9 years did not latch. ? ?Feeding ?Mother's Current Feeding Choice: Breast Milk and Formula ?Nipple Type: Slow - flow ? ?LATCH Score ?  ? ?  ? ?  ? ?  ? ?  ? ?  ? ? ?Lactation Tools Discussed/Used ?  ? ?Interventions ?Interventions: Breast feeding basics reviewed;Skin to skin;Position options;Education;LC Services brochure ? ?Discharge ?  ? ?Consult Status ?Consult Status: Follow-up ?Date: 11/17/21 ?Follow-up type: In-patient ? ? ? ?Danelle Earthly ?11/17/2021, 3:04 AM ? ? ? ?

## 2021-11-17 NOTE — Progress Notes (Signed)
POSTPARTUM PROGRESS NOTE ? ?Subjective: Shannon Diaz is a 31 y.o. [id number] POD#1 s/p rLTCS at [redacted]w[redacted]d.  She reports she doing well. No acute events overnight. She denies any problems with ambulating, voiding or po intake. Denies nausea or vomiting. She has  passed flatus. Pain is well controlled.  Lochia is scant. ? ?Objective: ?Blood pressure 128/72, pulse (!) 109, temperature 98.9 ?F (37.2 ?C), temperature source Oral, resp. rate 16, weight 112 kg, last menstrual period 03/26/2021, SpO2 98 %, unknown if currently breastfeeding. ? ?Physical Exam:  ?General: alert, cooperative and no distress ?Chest: no respiratory distress ?Abdomen: soft, non-tender  ?Uterine Fundus: firm, appropriately tender, dressing clean dry/intact ?Extremities: No calf swelling or tenderness  scant edema ? ?Recent Labs  ?  11/16/21 ?0255 11/17/21 ?0454  ?HGB 11.3* 9.5*  ?HCT 34.7* 28.4*  ? ? ?Assessment/Plan: ?Shannon Diaz is a 31 y.o. [id number]  POD#1 s/p rLTCS at [redacted]w[redacted]d (CS for arrest of dilation) ? ?Routine Postpartum Care: Doing well, pain well-controlled.  ?-- Continue routine care, lactation support  ?-- Contraception: BTL (done) ?-- Feeding: both ? ?#Chorioamnionitis ?On gent and clinda for 24 hours post op. No fevers ? ?Acute blood loss anemia ?HgB 11.3>9.5. Start PO iron ? ?#A2GDM ?Fasting CBG 114. Plan for 6 wk PP GTT ? ?#gHTN ?Two BP >130 or >80 after delivery. Will continue to monitor and start procardia if has one more BP in that range.  Will start 5 day course of lasix per protocol today ? ? ?Dispo: Plan for discharge POD# 2 or 3 depending patient preference and clinical status. ? ?Warner Mccreedy, MD, MPH ?OB Fellow, Faculty Practice ?[facility]  ? ?

## 2021-11-18 DIAGNOSIS — O139 Gestational [pregnancy-induced] hypertension without significant proteinuria, unspecified trimester: Secondary | ICD-10-CM

## 2021-11-18 LAB — GLUCOSE, CAPILLARY: Glucose-Capillary: 104 mg/dL — ABNORMAL HIGH (ref 70–99)

## 2021-11-18 MED ORDER — NIFEDIPINE ER 30 MG PO TB24
30.0000 mg | ORAL_TABLET | Freq: Every day | ORAL | 0 refills | Status: AC
Start: 2021-11-18 — End: 2022-01-17

## 2021-11-18 MED ORDER — OXYCODONE HCL 5 MG PO TABS: 5.0000 mg | ORAL_TABLET | Freq: Four times a day (QID) | ORAL | 0 refills | Status: DC | PRN

## 2021-11-18 MED ORDER — NIFEDIPINE ER OSMOTIC RELEASE 30 MG PO TB24
30.0000 mg | ORAL_TABLET | Freq: Every day | ORAL | Status: DC
  Administered 2021-11-18: 30 mg via ORAL
  Filled 2021-11-18: qty 1

## 2021-11-18 MED ORDER — FUROSEMIDE 20 MG PO TABS: 20.0000 mg | ORAL_TABLET | Freq: Every day | ORAL | 0 refills | Status: AC

## 2021-11-18 MED ORDER — IBUPROFEN 600 MG PO TABS: 600.0000 mg | ORAL_TABLET | Freq: Four times a day (QID) | ORAL | 0 refills | Status: DC

## 2021-11-18 MED ORDER — FERROUS SULFATE 325 (65 FE) MG PO TABS: 325.0000 mg | ORAL_TABLET | ORAL | 0 refills | Status: AC

## 2021-11-19 ENCOUNTER — Encounter (HOSPITAL_COMMUNITY): Payer: Self-pay | Admitting: Obstetrics & Gynecology

## 2021-11-19 ENCOUNTER — Other Ambulatory Visit: Payer: Medicaid Other

## 2021-11-20 ENCOUNTER — Encounter: Payer: Self-pay | Admitting: Family Medicine

## 2021-11-20 DIAGNOSIS — Z9079 Acquired absence of other genital organ(s): Secondary | ICD-10-CM | POA: Insufficient documentation

## 2021-11-20 LAB — SURGICAL PATHOLOGY

## 2021-11-21 ENCOUNTER — Telehealth: Payer: Self-pay | Admitting: Obstetrics & Gynecology

## 2021-11-21 NOTE — Telephone Encounter (Signed)
Returned pt's call, two identifiers used. Pt explained that she needed to have a bowel movement but was having issues. She had increased her water intake, increased fiber in her diet, tried prune juice and taken some magnesium. Pt was instructed to drink warm prune juice, take some Colace, Senakot, suppositories, or an enema based on how badly she felt or how urgent the need to defecate was. Sent pt Mychart msg with a list of OTC meds to try. Pt instructed to call back tomorrow if she was still having issues after doing these things. Pt confirmed understanding. ?

## 2021-11-21 NOTE — Telephone Encounter (Signed)
Patient called stating that she has a C-section not to long ago and she has not been able to go to the restroom in 2 days. Patient would like to know what she can take so she could use the restroom. Please contact pt, patient speaks english ?

## 2021-11-22 ENCOUNTER — Other Ambulatory Visit: Payer: Self-pay

## 2021-11-22 ENCOUNTER — Ambulatory Visit (INDEPENDENT_AMBULATORY_CARE_PROVIDER_SITE_OTHER): Payer: Medicaid Other | Admitting: Advanced Practice Midwife

## 2021-11-22 ENCOUNTER — Other Ambulatory Visit: Payer: Medicaid Other | Admitting: Advanced Practice Midwife

## 2021-11-22 ENCOUNTER — Encounter: Payer: Self-pay | Admitting: Advanced Practice Midwife

## 2021-11-22 VITALS — BP 142/95 | HR 112 | Ht 61.0 in | Wt 224.0 lb

## 2021-11-22 DIAGNOSIS — O9089 Other complications of the puerperium, not elsewhere classified: Secondary | ICD-10-CM

## 2021-11-22 NOTE — Progress Notes (Signed)
?  HPI: ?Patient returns for routine postoperative follow-up having undergone CS on 3/17.  ?Her incision started leaking yesterday. Denies fever ? ?Large amount of serosanguinous drainage noted from ~ 1cm area in middle of incision. No erythema/odor/other s/s of infection  Dr. Charlotta Newton called to evaluate.  Incision palpated, fascia intact.  Probed w/qtip, area of drainage opened up and irigated w/peroxide.  4X4s placed on top, instructions to keep area clean and dry,expect drainage for a few more days at least.  ? ? ?Current Outpatient Medications: ?Acetaminophen (TYLENOL PO), Take by mouth., Disp: , Rfl:  ?ferrous sulfate 325 (65 FE) MG tablet, Take 1 tablet (325 mg total) by mouth every other day., Disp: 30 tablet, Rfl: 0 ?ibuprofen (ADVIL) 600 MG tablet, Take 1 tablet (600 mg total) by mouth every 6 (six) hours., Disp: 30 tablet, Rfl: 0 ?NIFEdipine (ADALAT CC) 30 MG 24 hr tablet, Take 1 tablet (30 mg total) by mouth daily., Disp: 60 tablet, Rfl: 0 ?oxyCODONE (OXY IR/ROXICODONE) 5 MG immediate release tablet, Take 1 tablet (5 mg total) by mouth every 6 (six) hours as needed for severe pain., Disp: 10 tablet, Rfl: 0 ?prenatal vitamin w/FE, FA (PRENATAL 1 + 1) 27-1 MG TABS tablet, Take 1 tablet by mouth daily at 12 noon., Disp: 30 tablet, Rfl: 12 ?furosemide (LASIX) 20 MG tablet, Take 1 tablet (20 mg total) by mouth daily for 3 days., Disp: 3 tablet, Rfl: 0 ? ?No current facility-administered medications for this visit. ? ? ? ?Blood pressure (!) 142/95, pulse (!) 112, height 5\' 1"  (1.549 m), weight 224 lb (101.6 kg), last menstrual period 03/26/2021, not currently breastfeeding. ? ? ?Impression: ? ?1 weeks s/p CS, seroma.  ? ? ?Plan: ?Be alert for s/s of infection ?Keep clean and dry ?F/U Mon or Tues in office  ? ? ?Wed, CNM ? ?

## 2021-11-26 ENCOUNTER — Other Ambulatory Visit: Payer: Medicaid Other

## 2021-11-26 ENCOUNTER — Encounter: Payer: Medicaid Other | Admitting: Obstetrics & Gynecology

## 2021-11-28 ENCOUNTER — Encounter: Payer: Self-pay | Admitting: Women's Health

## 2021-11-28 ENCOUNTER — Ambulatory Visit (INDEPENDENT_AMBULATORY_CARE_PROVIDER_SITE_OTHER): Payer: Medicaid Other | Admitting: Women's Health

## 2021-11-28 ENCOUNTER — Other Ambulatory Visit: Payer: Self-pay

## 2021-11-28 VITALS — BP 131/84 | HR 77 | Ht 61.0 in | Wt 216.8 lb

## 2021-11-28 DIAGNOSIS — Z4889 Encounter for other specified surgical aftercare: Secondary | ICD-10-CM

## 2021-11-28 NOTE — Progress Notes (Signed)
? ?  GYN VISIT ?Patient name: Shannon Diaz MRN [id number]  Date of birth: [birth year] ?Chief Complaint:   ?Routine Post Op (Check incision) ? ?History of Present Illness:   ?Shannon Diaz is a 31 y.o. [phone] Hispanic female 12d s/p RCS being seen today for incision check, center was open and draining last week. Pt states doing much better. Bottlefeeding. Still taking procardia.     ?No LMP recorded. ? ?  07/25/2021  ?  2:21 PM 05/30/2021  ? 11:14 AM 05/01/2016  ?  2:05 PM  ?Depression screen PHQ 2/9  ?Decreased Interest 0 0 0  ?Down, Depressed, Hopeless 0 0 0  ?PHQ - 2 Score 0 0 0  ?Altered sleeping 0    ?Tired, decreased energy 0    ?Change in appetite 0    ?Feeling bad or failure about yourself  0    ?Trouble concentrating 0    ?Moving slowly or fidgety/restless 0    ?Suicidal thoughts 0    ?PHQ-9 Score 0    ? ?  ? ?  07/25/2021  ?  2:21 PM  ?GAD 7 : Generalized Anxiety Score  ?Nervous, Anxious, on Edge 0  ?Control/stop worrying 0  ?Worry too much - different things 0  ?Trouble relaxing 0  ?Restless 0  ?Easily annoyed or irritable 0  ?Afraid - awful might happen 0  ?Total GAD 7 Score 0  ? ? ? ?Review of Systems:   ?Pertinent items are noted in HPI ?Denies fever/chills, dizziness, headaches, visual disturbances, fatigue, shortness of breath, chest pain, abdominal pain, vomiting, abnormal vaginal discharge/itching/odor/irritation, problems with periods, bowel movements, urination, or intercourse unless otherwise stated above.  ?Pertinent History Reviewed:  ?Reviewed past medical,surgical, social, obstetrical and family history.  ?Reviewed problem list, medications and allergies. ?Physical Assessment:  ? ?Vitals:  ? 11/28/21 1109  ?BP: 131/84  ?Pulse: 77  ?Weight: 216 lb 12.8 oz (98.3 kg)  ?Height: 5\' 1"  (1.549 m)  ?Body mass index is 40.96 kg/m?. ? ?     Physical Examination:  ? General appearance: alert, well appearing, and in no distress ? Mental status: alert, oriented to person, place, and  time ? Skin: warm & dry  ? Cardiovascular: normal heart rate noted ? Respiratory: normal respiratory effort, no distress ? Abdomen: soft, non-tender, c/s incision healing well, no erythema/induration, no drainage.  ? Pelvic: examination not indicated ? Extremities: no edema  ? ?Chaperone: N/A   ? ?No results found for this or any previous visit (from the past 24 hour(s)).  ?Assessment & Plan:  ?1) 12d d/p RCS> bottlefeeding ? ?2) Incision check> healing well, keep clean/dry ? ?3) PPHTN> stop procardia 2d before pp visit ? ?Meds: No orders of the defined types were placed in this encounter. ? ? ?No orders of the defined types were placed in this encounter. ? ? ?Return for As scheduled. ? ? CNM, WHNP-BC ?11/28/2021 ?11:19 AM  ?

## 2021-11-28 NOTE — Patient Instructions (Signed)
Stop blood pressure medicine 2 days before postpartum visit 

## 2021-12-24 ENCOUNTER — Other Ambulatory Visit: Payer: Medicaid Other

## 2021-12-24 ENCOUNTER — Ambulatory Visit: Payer: Medicaid Other | Admitting: Women's Health

## 2021-12-24 ENCOUNTER — Encounter: Payer: Self-pay | Admitting: Obstetrics & Gynecology

## 2021-12-24 ENCOUNTER — Ambulatory Visit (INDEPENDENT_AMBULATORY_CARE_PROVIDER_SITE_OTHER): Payer: Medicaid Other | Admitting: Obstetrics & Gynecology

## 2021-12-24 VITALS — BP 135/82 | HR 84 | Ht 61.0 in | Wt 222.0 lb

## 2021-12-24 DIAGNOSIS — Z9889 Other specified postprocedural states: Secondary | ICD-10-CM

## 2021-12-24 NOTE — Progress Notes (Signed)
  HPI: Patient returns for routine postoperative follow-up having undergone repeat C section + BTL. On 11/16/21 The patient's immediate postoperative recovery has been unremarkable. Since hospital discharge the patient reports no problems.   Current Outpatient Medications: ferrous sulfate 325 (65 FE) MG tablet, Take 1 tablet (325 mg total) by mouth every other day., Disp: 30 tablet, Rfl: 0 prenatal vitamin w/FE, FA (PRENATAL 1 + 1) 27-1 MG TABS tablet, Take 1 tablet by mouth daily at 12 noon., Disp: 30 tablet, Rfl: 12 furosemide (LASIX) 20 MG tablet, Take 1 tablet (20 mg total) by mouth daily for 3 days., Disp: 3 tablet, Rfl: 0 NIFEdipine (ADALAT CC) 30 MG 24 hr tablet, Take 1 tablet (30 mg total) by mouth daily. (Patient not taking: Reported on 12/24/2021), Disp: 60 tablet, Rfl: 0  No current facility-administered medications for this visit.    Blood pressure 135/82, pulse 84, height 5\' 1"  (1.549 m), weight 222 lb (100.7 kg), not currently breastfeeding.  Physical Exam: Incision some moisture changes  Diagnostic Tests:   Pathology: benign  Impression + Management plan:   ICD-10-CM   1. Post-operative state: C section 11/16/21 + BTL  Z98.890          Medications Prescribed this encounter: No orders of the defined types were placed in this encounter.     Follow up: prn   [name], MD Attending Physician for the [facility] 12/24/2021 12:05 PM

## 2022-01-13 ENCOUNTER — Telehealth: Payer: Medicaid Other | Admitting: Family Medicine

## 2022-01-13 DIAGNOSIS — N922 Excessive menstruation at puberty: Secondary | ICD-10-CM | POA: Diagnosis not present

## 2022-01-13 DIAGNOSIS — N920 Excessive and frequent menstruation with regular cycle: Secondary | ICD-10-CM | POA: Diagnosis not present

## 2022-01-13 NOTE — Patient Instructions (Signed)
Abnormal Uterine Bleeding ? ?Abnormal uterine bleeding is unusual bleeding from the uterus. It includes bleeding after sex, or bleeding or spotting between menstrual periods. It may also include bleeding that is heavier than normal, menstrual periods that last longer than usual, or bleeding that occurs after menopause. ?Abnormal uterine bleeding can affect teenagers, women in their reproductive years, pregnant women, and women who have reached menopause. Common causes of abnormal uterine bleeding include: ?Pregnancy. ?Abnormal growths within the lining of the uterus (polyps). ?Benign tumors or growths in the uterus (fibroids). These are not cancer. ?Infection. ?Cancer. ?Too much or too little of some hormones in the body (hormonal imbalances). ?Any type of abnormal bleeding should be checked by a health care provider. Many cases are minor and simple to treat, but others may be more serious. Treatment will depend on the cause of the bleeding and how severe it is. ?Follow these instructions at home: ?Medicines ?Take over-the-counter and prescription medicines only as told by your health care provider. ?Ask your health care provider about: ?Taking medicines such as aspirin and ibuprofen. These medicines can thin your blood. Do not take these medicines unless your health care provider tells you to take them. ?Taking over-the-counter medicines, vitamins, herbs, and supplements. ?If you were prescribed iron pills, take them as told by your health care provider. Iron pills help to replace iron that your body loses because of this condition. ?Managing constipation ?In cases of severe bleeding, you may be asked to increase your iron intake to treat anemia. Doing this may cause constipation. To prevent or treat constipation, you may need to: ?Drink enough fluid to keep your urine pale yellow. ?Take over-the-counter or prescription medicines. ?Eat foods that are high in fiber, such as beans, whole grains, and fresh fruits and  vegetables. ?Limit foods that are high in fat and processed sugars, such as fried or sweet foods. ?Activity ?Alter your activity to decrease bleeding if you need to change your sanitary pad more than one time every 2 hours: ?Lie in bed with your feet raised (elevated). ?Place a cold pack on your lower abdomen. ?Rest as much as possible until the bleeding stops or slows down. ?General instructions ?Do not use tampons, douche, or have sex until your health care provider says these things are okay. ?Change your sanitary pads often. ?Get regular exams. These include pelvic exams and cervical cancer screenings. ?It is up to you to get the results of any tests that are done. Ask your health care provider, or the department that is doing the tests, when your results will be ready. ?Monitor your condition for any changes. For 2 months, write down: ?When your menstrual period starts. ?When your menstrual period ends. ?When any abnormal vaginal bleeding occurs. ?What problems you notice. ?Keep all follow-up visits. This is important. ?Contact a health care provider if: ?You have bleeding that lasts for more than one week. ?You feel dizzy at times. ?You feel nauseous or you vomit. ?You feel light-headed or weak. ?You notice any other changes that show that your condition is getting worse. ?Get help right away if: ?You faint. ?You have bleeding that soaks through a sanitary pad every hour. ?You have pain in the abdomen. ?You have a fever or chills. ?You become sweaty or weak. ?You pass large blood clots from your vagina. ?These symptoms may represent a serious problem that is an emergency. Do not wait to see if the symptoms will go away. Get medical help right away. Call your local emergency services (  911 in the [place] Do not drive yourself to the hospital. ?Summary ?Abnormal uterine bleeding is unusual bleeding from the uterus. ?Any type of abnormal bleeding should be checked by a health care provider. Many cases are minor and  simple to treat, but others may be more serious. ?Treatment will depend on the cause of the bleeding and how severe it is. ?Get help right away if you faint, you have bleeding that soaks through a sanitary pad every hour, or you pass large blood clots from your vagina. ?This information is not intended to replace advice given to you by your health care provider. Make sure you discuss any questions you have with your health care provider. ?Document Revised: 12/19/2020 Document Reviewed: 12/19/2020 ?Elsevier Patient Education ? 2023 [employer] ? ?

## 2022-01-13 NOTE — Progress Notes (Signed)
?Virtual Visit Consent  ? ?[name], you are scheduled for a virtual visit with a [name] provider today. Just as with appointments in the office, your consent must be obtained to participate. Your consent will be active for this visit and any virtual visit you may have with one of our providers in the next 365 days. If you have a MyChart account, a copy of this consent can be sent to you electronically. ? ?As this is a virtual visit, video technology does not allow for your provider to perform a traditional examination. This may limit your provider's ability to fully assess your condition. If your provider identifies any concerns that need to be evaluated in person or the need to arrange testing (such as labs, EKG, etc.), we will make arrangements to do so. Although advances in technology are sophisticated, we cannot ensure that it will always work on either your end or our end. If the connection with a video visit is poor, the visit may have to be switched to a telephone visit. With either a video or telephone visit, we are not always able to ensure that we have a secure connection. ? ?By engaging in this virtual visit, you consent to the provision of healthcare and authorize for your insurance to be billed (if applicable) for the services provided during this visit. Depending on your insurance coverage, you may receive a charge related to this service. ? ?I need to obtain your verbal consent now. Are you willing to proceed with your visit today? Shannon Diaz has provided verbal consent on 01/13/2022 for a virtual visit (video or telephone). Georgana Curio, FNP ? ?Date: 01/13/2022 6:10 PM ? ?Virtual Visit via Video Note  ? ?I, Georgana Curio, connected with  Shannon Diaz  ([id number], [birth year]) on 01/13/22 at  6:00 PM EDT by a video-enabled telemedicine application and verified that I am speaking with the correct person using two identifiers. ? ?Location: ?Patient: Virtual Visit  Location Patient: Home ?Provider: Virtual Visit Location Provider: Home Office ?  ?I discussed the limitations of evaluation and management by telemedicine and the availability of in person appointments. The patient expressed understanding and agreed to proceed.   ? ?History of Present Illness: ?Shannon Diaz is a 31 y.o. who identifies as a female who was assigned female at birth, and is being seen today for Heavy menstrual bleeding. She says she had a c-section 2 months ago and she is bleeding through 2-3 large pads in 1-2 hrs since yesterday. Denies fever and abd pain. . ? ?HPI: HPI  ?Problems:  ?Patient Active Problem List  ? Diagnosis Date Noted  ? H/O bilateral salpingectomy 11/20/2021  ? Gestational HTN 11/18/2021  ? Gestational diabetes mellitus (GDM) controlled on oral hypoglycemic drug, antepartum 11/15/2021  ? Gestational diabetes 08/06/2021  ? 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 Prediabetes 07/28/2021  ? History of cesarean delivery 07/25/2021  ? Late prenatal care 07/25/2021  ? Supervision of high risk pregnancy, antepartum 07/23/2021  ? Lower leg mass, left 06/22/2021  ? Elevated BP without diagnosis of hypertension 05/15/2021  ?  ?Allergies:  ?Allergies  ?Allergen Reactions  ? Penicillins Swelling and Rash  ?  Skin gets red  ? ?Medications:  ?Current Outpatient Medications:  ?  ferrous sulfate 325 (65 FE) MG tablet, Take 1 tablet (325 mg total) by mouth every other day., Disp: 30 tablet, Rfl: 0 ?  furosemide (LASIX) 20 MG tablet, Take 1 tablet (20 mg total) by mouth daily for  3 days., Disp: 3 tablet, Rfl: 0 ?  NIFEdipine (ADALAT CC) 30 MG 24 hr tablet, Take 1 tablet (30 mg total) by mouth daily. (Patient not taking: Reported on 12/24/2021), Disp: 60 tablet, Rfl: 0 ?  prenatal vitamin w/FE, FA (PRENATAL 1 + 1) 27-1 MG TABS tablet, Take 1 tablet by mouth daily at 12 noon., Disp: 30 tablet, Rfl: 12 ? ?Observations/Objective: ?Patient is well-developed, well-nourished in no acute distress.  ?Resting  comfortably  at home.  ?Head is normocephalic, atraumatic.  ?No labored breathing.  ?Speech is clear and coherent with logical content.  ?Patient is alert and oriented at baseline.  ? ? ?Assessment and Plan: ?1. Excessive menstruation at puberty ? ?Pt instructed to go to the ED for further evaluation.  ? ?Follow Up Instructions: ?I discussed the assessment and treatment plan with the patient. The patient was provided an opportunity to ask questions and all were answered. The patient agreed with the plan and demonstrated an understanding of the instructions.  A copy of instructions were sent to the patient via MyChart unless otherwise noted below.  ? ? ? ?The patient was advised to call back or seek an in-person evaluation if the symptoms worsen or if the condition fails to improve as anticipated. ? ?Time:  ?I spent 8 minutes with the patient via telehealth technology discussing the above problems/concerns.   ? ?Georgana Curio, FNP  ?

## 2022-04-30 ENCOUNTER — Emergency Department (HOSPITAL_COMMUNITY)
Admission: EM | Admit: 2022-04-30 | Discharge: 2022-05-01 | Disposition: A | Discharge status: Home / Self Care | Payer: Medicaid Other | Attending: Emergency Medicine | Admitting: Emergency Medicine

## 2022-04-30 DIAGNOSIS — N83291 Other ovarian cyst, right side: Secondary | ICD-10-CM | POA: Insufficient documentation

## 2022-04-30 DIAGNOSIS — N83209 Unspecified ovarian cyst, unspecified side: Secondary | ICD-10-CM

## 2022-04-30 DIAGNOSIS — N83292 Other ovarian cyst, left side: Secondary | ICD-10-CM | POA: Insufficient documentation

## 2022-04-30 DIAGNOSIS — R102 Pelvic and perineal pain: Secondary | ICD-10-CM | POA: Diagnosis present

## 2022-05-01 ENCOUNTER — Encounter (HOSPITAL_COMMUNITY): Payer: Self-pay | Admitting: Emergency Medicine

## 2022-05-01 ENCOUNTER — Other Ambulatory Visit: Payer: Self-pay

## 2022-05-01 ENCOUNTER — Emergency Department (HOSPITAL_COMMUNITY): Payer: Medicaid Other

## 2022-05-01 LAB — CBC WITH DIFFERENTIAL/PLATELET
Abs Immature Granulocytes: 0.02 10*3/uL (ref 0.00–0.07)
Basophils Absolute: 0 10*3/uL (ref 0.0–0.1)
Basophils Relative: 0 %
Eosinophils Absolute: 0.2 10*3/uL (ref 0.0–0.5)
Eosinophils Relative: 2 %
HCT: 34.8 % — ABNORMAL LOW (ref 36.0–46.0)
Hemoglobin: 11.5 g/dL — ABNORMAL LOW (ref 12.0–15.0)
Immature Granulocytes: 0 %
Lymphocytes Relative: 35 %
Lymphs Abs: 3.1 10*3/uL (ref 0.7–4.0)
MCH: 24.5 pg — ABNORMAL LOW (ref 26.0–34.0)
MCHC: 33 g/dL (ref 30.0–36.0)
MCV: 74 fL — ABNORMAL LOW (ref 80.0–100.0)
Monocytes Absolute: 0.7 10*3/uL (ref 0.1–1.0)
Monocytes Relative: 8 %
Neutro Abs: 4.9 10*3/uL (ref 1.7–7.7)
Neutrophils Relative %: 55 %
Platelets: 376 10*3/uL (ref 150–400)
RBC: 4.7 MIL/uL (ref 3.87–5.11)
RDW: 16.7 % — ABNORMAL HIGH (ref 11.5–15.5)
WBC: 8.9 10*3/uL (ref 4.0–10.5)
nRBC: 0 % (ref 0.0–0.2)

## 2022-05-01 LAB — I-STAT BETA HCG BLOOD, ED (MC, WL, AP ONLY): I-stat hCG, quantitative: <5 m[IU]/mL (ref <5)

## 2022-05-01 LAB — COMPREHENSIVE METABOLIC PANEL
ALT: 28 U/L (ref 0–44)
AST: 20 U/L (ref 15–41)
Albumin: 3.3 g/dL — ABNORMAL LOW (ref 3.5–5.0)
Alkaline Phosphatase: 57 U/L (ref 38–126)
Anion gap: 7 (ref 5–15)
BUN: 14 mg/dL (ref 6–20)
CO2: 24 mmol/L (ref 22–32)
Calcium: 8.8 mg/dL — ABNORMAL LOW (ref 8.9–10.3)
Chloride: 106 mmol/L (ref 98–111)
Creatinine, Ser: 0.97 mg/dL (ref 0.44–1.00)
GFR, Estimated: >60 mL/min (ref >60)
Glucose, Bld: 107 mg/dL — ABNORMAL HIGH (ref 70–99)
Potassium: 3.7 mmol/L (ref 3.5–5.1)
Sodium: 137 mmol/L (ref 135–145)
Total Bilirubin: 0.3 mg/dL (ref 0.3–1.2)
Total Protein: 7.2 g/dL (ref 6.5–8.1)

## 2022-05-01 LAB — URINALYSIS, ROUTINE W REFLEX MICROSCOPIC
Bilirubin Urine: NEGATIVE
Glucose, UA: NEGATIVE mg/dL
Hgb urine dipstick: NEGATIVE
Ketones, ur: NEGATIVE mg/dL
Nitrite: NEGATIVE
Protein, ur: 30 mg/dL — AB
Specific Gravity, Urine: 1.019 (ref 1.005–1.030)
pH: 5 (ref 5.0–8.0)

## 2022-05-01 MED ORDER — KETOROLAC TROMETHAMINE 30 MG/ML IJ SOLN
30.0000 mg | Freq: Once | INTRAMUSCULAR | Status: AC
  Administered 2022-05-01: 30 mg via INTRAMUSCULAR
  Filled 2022-05-01: qty 1

## 2022-05-01 MED ORDER — NAPROXEN 500 MG PO TABS: 500.0000 mg | ORAL_TABLET | Freq: Two times a day (BID) | ORAL | 0 refills | Status: AC | PRN

## 2022-05-01 NOTE — ED Triage Notes (Signed)
Pt has left sided pelvic pain.  Pt's pain is "worse w/ sex." Denies discharge, fevers, pain w/ urination.

## 2022-05-01 NOTE — ED Provider Notes (Signed)
[facility] EMERGENCY DEPARTMENT Provider Note   CSN: [id number] Arrival date & time: 04/30/22  2321     History  Chief Complaint  Patient presents with   Pelvic Pain    Shannon Diaz is a 31 y.o. female w/ a hx of tubal ligation who presents to the ED with complaints of pelvic pain that began earlier today. Pain is to the left pelvis radiates to the back/side at times, aggravated by intercourse, no alleviating factors. Denies fever, vomiting, dysuria, vaginal bleeding or vaginal discharge.   HPI     Home Medications Prior to Admission medications   Medication Sig Start Date End Date Taking? Authorizing Provider  ferrous sulfate 325 (65 FE) MG tablet Take 1 tablet (325 mg total) by mouth every other day. 11/19/21 01/18/22  Warner Mccreedy, MD  furosemide (LASIX) 20 MG tablet Take 1 tablet (20 mg total) by mouth daily for 3 days. 11/18/21 11/21/21  Warner Mccreedy, MD  NIFEdipine (ADALAT CC) 30 MG 24 hr tablet Take 1 tablet (30 mg total) by mouth daily. Patient not taking: Reported on 12/24/2021 11/18/21 01/17/22  Warner Mccreedy, MD  prenatal vitamin w/FE, FA (PRENATAL 1 + 1) 27-1 MG TABS tablet Take 1 tablet by mouth daily at 12 noon. 05/15/21   Adline Potter, NP      Allergies    Penicillins    Review of Systems   Review of Systems  Constitutional:  Negative for chills and fever.  Respiratory:  Negative for shortness of breath.   Cardiovascular:  Negative for chest pain.  Gastrointestinal:  Negative for blood in stool, constipation, diarrhea and vomiting.  Genitourinary:  Positive for pelvic pain. Negative for dysuria, vaginal bleeding and vaginal discharge.  All other systems reviewed and are negative.   Physical Exam Updated Vital Signs BP (!) 140/84 (BP Location: Right Arm)   Pulse 86   Temp 98.3 F (36.8 C) (Oral)   Resp 16   Ht 5\' 1"  (1.549 m)   SpO2 100%   BMI 41.95 kg/m  Physical Exam Vitals and nursing note reviewed.  Constitutional:       General: She is not in acute distress.    Appearance: She is well-developed. She is not toxic-appearing.  HENT:     Head: Normocephalic and atraumatic.  Eyes:     General:        Right eye: No discharge.        Left eye: No discharge.     Conjunctiva/sclera: Conjunctivae normal.  Cardiovascular:     Rate and Rhythm: Normal rate and regular rhythm.  Pulmonary:     Effort: No respiratory distress.     Breath sounds: Normal breath sounds. No wheezing or rales.  Abdominal:     General: There is no distension.     Palpations: Abdomen is soft.     Tenderness: There is abdominal tenderness (left suprapubic). There is no right CVA tenderness, left CVA tenderness, guarding or rebound.  Genitourinary:    Comments: Deferred Musculoskeletal:     Cervical back: Neck supple.  Skin:    General: Skin is warm and dry.  Neurological:     Mental Status: She is alert.     Comments: Clear speech.   Psychiatric:        Behavior: Behavior normal.     ED Results / Procedures / Treatments   Labs (all labs ordered are listed, but only abnormal results are displayed) Labs Reviewed  COMPREHENSIVE METABOLIC PANEL - Abnormal;  Notable for the following components:      Result Value   Glucose, Bld 107 (*)    Calcium 8.8 (*)    Albumin 3.3 (*)    All other components within normal limits  CBC WITH DIFFERENTIAL/PLATELET - Abnormal; Notable for the following components:   Hemoglobin 11.5 (*)    HCT 34.8 (*)    MCV 74.0 (*)    MCH 24.5 (*)    RDW 16.7 (*)    All other components within normal limits  URINALYSIS, ROUTINE W REFLEX MICROSCOPIC - Abnormal; Notable for the following components:   APPearance HAZY (*)    Protein, ur 30 (*)    Leukocytes,Ua TRACE (*)    Bacteria, UA RARE (*)    All other components within normal limits  I-STAT BETA HCG BLOOD, ED ([facility], [facility], [facility] ONLY)    EKG None  Radiology US Pelvis Complete  Result Date: 05/01/2022 CLINICAL DATA:  Pelvic pain EXAM: TRANSABDOMINAL  AND TRANSVAGINAL ULTRASOUND OF PELVIS DOPPLER ULTRASOUND OF OVARIES TECHNIQUE: Both transabdominal and transvaginal ultrasound examinations of the pelvis were performed. Transabdominal technique was performed for global imaging of the pelvis including uterus, ovaries, adnexal regions, and pelvic cul-de-sac. It was necessary to proceed with endovaginal exam following the transabdominal exam to visualize the endometrium and bilateral ovaries. Color and duplex Doppler ultrasound was utilized to evaluate blood flow to the ovaries. COMPARISON:  None Available. FINDINGS: Uterus Measurements: 9.4 x 4.5 x 5.5 cm = volume: 120 mL. No fibroids or other mass visualized. Endometrium Thickness: 11 mm.  No focal abnormality visualized. Right ovary Measurements: 3.1 x 2.2 x 3.1 cm = volume: 11.3 mL. Normal appearance/no adnexal mass. Left ovary Measurements: 5.5 x 4.1 x 5.5 cm = volume: 59.2 mL. 4.3 x 3.5 x 3.8 cm hemorrhagic cyst, benign. No follow-up is recommended. Pulsed Doppler evaluation of both ovaries demonstrates normal low-resistance arterial and venous waveforms. Other findings Trace pelvic fluid, physiologic. IMPRESSION: 4.3 cm hemorrhagic left ovarian cyst, benign. No follow-up is recommended. No evidence of ovarian torsion. Electronically Signed   By: Charline Bills M.D.   On: 05/01/2022 01:48   US Transvaginal Non-OB  Result Date: 05/01/2022 CLINICAL DATA:  Pelvic pain EXAM: TRANSABDOMINAL AND TRANSVAGINAL ULTRASOUND OF PELVIS DOPPLER ULTRASOUND OF OVARIES TECHNIQUE: Both transabdominal and transvaginal ultrasound examinations of the pelvis were performed. Transabdominal technique was performed for global imaging of the pelvis including uterus, ovaries, adnexal regions, and pelvic cul-de-sac. It was necessary to proceed with endovaginal exam following the transabdominal exam to visualize the endometrium and bilateral ovaries. Color and duplex Doppler ultrasound was utilized to evaluate blood flow to the  ovaries. COMPARISON:  None Available. FINDINGS: Uterus Measurements: 9.4 x 4.5 x 5.5 cm = volume: 120 mL. No fibroids or other mass visualized. Endometrium Thickness: 11 mm.  No focal abnormality visualized. Right ovary Measurements: 3.1 x 2.2 x 3.1 cm = volume: 11.3 mL. Normal appearance/no adnexal mass. Left ovary Measurements: 5.5 x 4.1 x 5.5 cm = volume: 59.2 mL. 4.3 x 3.5 x 3.8 cm hemorrhagic cyst, benign. No follow-up is recommended. Pulsed Doppler evaluation of both ovaries demonstrates normal low-resistance arterial and venous waveforms. Other findings Trace pelvic fluid, physiologic. IMPRESSION: 4.3 cm hemorrhagic left ovarian cyst, benign. No follow-up is recommended. No evidence of ovarian torsion. Electronically Signed   By: Charline Bills M.D.   On: 05/01/2022 01:48   [place] Art/Ven Flow Abd Pelv Doppler  Result Date: 05/01/2022 CLINICAL DATA:  Pelvic pain EXAM: TRANSABDOMINAL AND TRANSVAGINAL ULTRASOUND  OF PELVIS DOPPLER ULTRASOUND OF OVARIES TECHNIQUE: Both transabdominal and transvaginal ultrasound examinations of the pelvis were performed. Transabdominal technique was performed for global imaging of the pelvis including uterus, ovaries, adnexal regions, and pelvic cul-de-sac. It was necessary to proceed with endovaginal exam following the transabdominal exam to visualize the endometrium and bilateral ovaries. Color and duplex Doppler ultrasound was utilized to evaluate blood flow to the ovaries. COMPARISON:  None Available. FINDINGS: Uterus Measurements: 9.4 x 4.5 x 5.5 cm = volume: 120 mL. No fibroids or other mass visualized. Endometrium Thickness: 11 mm.  No focal abnormality visualized. Right ovary Measurements: 3.1 x 2.2 x 3.1 cm = volume: 11.3 mL. Normal appearance/no adnexal mass. Left ovary Measurements: 5.5 x 4.1 x 5.5 cm = volume: 59.2 mL. 4.3 x 3.5 x 3.8 cm hemorrhagic cyst, benign. No follow-up is recommended. Pulsed Doppler evaluation of both ovaries demonstrates normal  low-resistance arterial and venous waveforms. Other findings Trace pelvic fluid, physiologic. IMPRESSION: 4.3 cm hemorrhagic left ovarian cyst, benign. No follow-up is recommended. No evidence of ovarian torsion. Electronically Signed   By: Charline Bills M.D.   On: 05/01/2022 01:48    Procedures Procedures    Medications Ordered in ED Medications  ketorolac (TORADOL) 30 MG/ML injection 30 mg (30 mg Intramuscular Given 05/01/22 0245)    ED Course/ Medical Decision Making/ A&P                           Medical Decision Making Amount and/or Complexity of Data Reviewed Labs: ordered. Radiology: ordered.   Patient presents to the ED with complaints of pelvic pain, this involves an extensive number of treatment options, and is a complaint that carries with it a high risk of complications and morbidity. Nontoxic, vitals w/ elevated BP- doubt HTN emergency. TTP to the left suprapubic region on exam, no peritoneal signs.   Ddx including but not limited to: ovarian cyst, ovarian torsion, ectopic pregnancy, IUP, diverticulitis, pyelo, constipation, kidney stone, PID.   Additional history obtained:  Chart/nursing notes reviewed  Lab Tests:  I viewed & interpreted labs including:  CBC: anemia similar to prior.  CMP: mild hypoalbuminemia, no critical electrolyte derangement.  UA: not grossly infected.  Preg test: Negative  Imaging Studies:  I ordered and viewed the following imaging, agree with radiologist impression:  Pelvic US: 4.3 cm hemorrhagic left ovarian cyst, benign. No follow-up is recommended. No evidence of ovarian torsion.  ED Course:  [place] w/ left sided hemorrhagic ovarian cyst- suspect this is likely cause of patient's pain, no findings of torsion, preg test negative. We discussed pelvic exam including risk/benefits, patient declined which I feels is reasonable- no atypical discharge or concern for STI per patient. Will tx w/ NSAIDs, OBGYN follow up. I discussed results,  treatment plan, need for follow-up, and return precautions with the patient. Provided opportunity for questions, patient confirmed understanding and is in agreement with plan.   Portions of this note were generated with Scientist, clinical (histocompatibility and immunogenetics). Dictation errors may occur despite best attempts at proofreading.  Final Clinical Impression(s) / ED Diagnoses Final diagnoses:  Hemorrhagic ovarian cyst    Rx / DC Orders ED Discharge Orders          Ordered    naproxen (NAPROSYN) 500 MG tablet  2 times daily PRN        05/01/22 0241              Adeyemi Hamad, Pleas Koch, PA-C 05/01/22 0248  Sabas Sous, MD 05/01/22 [phone]

## 2022-05-01 NOTE — ED Provider Triage Note (Signed)
Emergency Medicine Provider Triage Evaluation Note  Shannon Diaz , a 31 y.o. female  was evaluated in triage.  Pt complains of intermittent left sided pelvic pain that radiates to her back/side at times, intermittent, worse with intercourse, no alleviating factors. Denies fever, vomiting, dysuria, vaginal bleeding or vaginal discharge.   Review of Systems  Per above  Physical Exam  BP (!) 140/84 (BP Location: Right Arm)   Pulse 86   Temp 98.3 F (36.8 C) (Oral)   Resp 16   SpO2 100%  Gen:   Awake, no distress   Resp:  Normal effort  MSK:   Moves extremities without difficulty  Other:  Left suprapubic TTP.   Medical Decision Making  Medically screening exam initiated at 12:09 AM.  Appropriate orders placed.  Saira Islay Polanco was informed that the remainder of the evaluation will be completed by another provider, this initial triage assessment does not replace that evaluation, and the importance of remaining in the ED until their evaluation is complete.  Pelvic pain.    Cherly Anderson, PA-C 05/01/22 0015

## 2022-05-01 NOTE — Discharge Instructions (Addendum)
You were seen in the ER for pelvic pain Your ultrasound showed findings of a hemorrhagic ovarian cyst which we suspect is the cause of your symptoms- please see attached handout.   We are sending you home with naproxen to take as needed for pain.   - Naproxen- this is a nonsteroidal anti-inflammatory medication that will help with pain and swelling. Be sure to take this medication as prescribed with food, 1 pill every 12 hours,  It should be taken with food, as it can cause stomach upset, and more seriously, stomach bleeding. Do not take other nonsteroidal anti-inflammatory medications with this such as Advil, Motrin, Aleve, Mobic, Goodie Powder, or Motrin etc..    You make take Tylenol per over the counter dosing with these medications.   We have prescribed you new medication(s) today. Discuss the medications prescribed today with your pharmacist as they can have adverse effects and interactions with your other medicines including over the counter and prescribed medications. Seek medical evaluation if you start to experience new or abnormal symptoms after taking one of these medicines, seek care immediately if you start to experience difficulty breathing, feeling of your throat closing, facial swelling, or rash as these could be indications of a more serious allergic reaction   Follow up with your gynecologist within 1 week. Return to the ER for new or worsening symptoms including but not limited to new or worsening pain, fever, inability to keep fluids down, passing out or any other concerns.

## 2022-05-07 ENCOUNTER — Ambulatory Visit: Payer: Medicaid Other | Admitting: Obstetrics & Gynecology

## 2022-05-08 ENCOUNTER — Ambulatory Visit: Payer: Medicaid Other | Admitting: Obstetrics & Gynecology

## 2022-05-09 ENCOUNTER — Ambulatory Visit: Payer: Medicaid Other | Admitting: Obstetrics & Gynecology

## 2022-05-28 NOTE — Progress Notes (Signed)
Chief Complaint  Patient presents with   biopsy left lower leg lesion      31 y.o. [id number] Patient's last menstrual period was 03/26/2021 (approximate). The current method of family planning is pregnant.  Outpatient Encounter Medications as of 06/22/2021  Medication Sig   prenatal vitamin w/FE, FA (PRENATAL 1 + 1) 27-1 MG TABS tablet Take 1 tablet by mouth daily at 12 noon.   No facility-administered encounter medications on file as of 06/22/2021.    Subjective Referred from ED with mass left leg I examined and felt it needed to be removed ASAP I contacted Dr Henreitta Leber who was willing to do a wide local excision under local at [facility] nd made arrangements Past Medical History:  Diagnosis Date   Gestational diabetes    Late prenatal care    22 weeks   Pre-eclampsia    Sickle cell trait (HCC)    HgbS trait positive   Trichomonas vaginalis (TV) infection    During pregnancy    Past Surgical History:  Procedure Laterality Date   CESAREAN SECTION  09/05/2012   Procedure: CESAREAN SECTION;  Surgeon: Allie Bossier, MD;  Location: [facility] ORS;  Service: Obstetrics;  Laterality: N/A;  Primary cesarean section of baby boy at 2342 APGAR 7/8   CESAREAN SECTION  11/16/2021   Procedure: CESAREAN SECTION;  Surgeon: Federico Flake, MD;  Location: [facility] LD ORS;  Service: Obstetrics;;   LIPOMA EXCISION Left 06/22/2021   Procedure: MINOR EXCISION MASS LEG;  Surgeon: Lucretia Roers, MD;  Location: [facility] ORS;  Service: General;  Laterality: Left;    OB History as of 11/22/2021     Gravida  3   Para  3   Term  2   Preterm  1   AB  0   Living  3      SAB  0   IAB  0   Ectopic  0   Multiple  0   Live Births  3           Allergies  Allergen Reactions   Penicillins Swelling and Rash    Skin gets red    Social History   Socioeconomic History   Marital status: Significant Other    Spouse name: Not on file   Number of children: Not on file   Years of  education: Not on file   Highest education level: Not on file  Occupational History   Not on file  Tobacco Use   Smoking status: Never   Smokeless tobacco: Never  Vaping Use   Vaping Use: Former   Start date: 11/15/2020  Substance and Sexual Activity   Alcohol use: Not Currently   Drug use: No   Sexual activity: Not Currently    Birth control/protection: None  Other Topics Concern   Not on file  Social History Narrative   Not on file   Social Determinants of Health   Financial Resource Strain: Low Risk  (07/25/2021)   Overall Financial Resource Strain (CARDIA)    Difficulty of Paying Living Expenses: Not very hard  Food Insecurity: No Food Insecurity (07/25/2021)   Hunger Vital Sign    Worried About Running Out of Food in the Last Year: Never true    Ran Out of Food in the Last Year: Never true  Transportation Needs: No Transportation Needs (07/25/2021)   PRAPARE - Administrator, Civil Service (Medical): No    Lack of Transportation (Non-Medical): No  Physical Activity: Insufficiently Active (07/25/2021)   Exercise Vital Sign    Days of Exercise per Week: 2 days    Minutes of Exercise per Session: 20 min  Stress: No Stress Concern Present (07/25/2021)   [place]    Feeling of Stress : Not at all  Social Connections: Moderately Integrated (07/25/2021)   Social Connection and Isolation Panel [NHANES]    Frequency of Communication with Friends and Family: More than three times a week    Frequency of Social Gatherings with Friends and Family: Once a week    Attends Religious Services: More than 4 times per year    Active Member of [employer] or Organizations: No    Attends Music therapist: Never    Marital Status: Living with partner    Family History  Problem Relation Age of Onset   Hypertension Maternal Grandmother    Diabetes Maternal Grandmother    Diabetes Maternal  Grandfather    Hypertension Mother    Diabetes Mother    Diabetes Other     Medications:       Current Outpatient Medications:    prenatal vitamin w/FE, FA (PRENATAL 1 + 1) 27-1 MG TABS tablet, Take 1 tablet by mouth daily at 12 noon., Disp: 30 tablet, Rfl: 12   ferrous sulfate 325 (65 FE) MG tablet, Take 1 tablet (325 mg total) by mouth every other day., Disp: 30 tablet, Rfl: 0   furosemide (LASIX) 20 MG tablet, Take 1 tablet (20 mg total) by mouth daily for 3 days., Disp: 3 tablet, Rfl: 0   naproxen (NAPROSYN) 500 MG tablet, Take 1 tablet (500 mg total) by mouth 2 (two) times daily as needed for moderate pain., Disp: 15 tablet, Rfl: 0   NIFEdipine (ADALAT CC) 30 MG 24 hr tablet, Take 1 tablet (30 mg total) by mouth daily. (Patient not taking: Reported on 12/24/2021), Disp: 60 tablet, Rfl: 0  Objective Blood pressure 120/77, pulse (!) 101, weight 234 lb (106.1 kg), last menstrual period 03/26/2021, not currently breastfeeding.  Fungating mass left lower leg weepy  Pertinent ROS   Labs or studies     Impression + Management Plan: Diagnoses this Encounter::   ICD-10-CM   1. Mass of left lower leg  R22.42         Medications prescribed during  this encounter: No orders of the defined types were placed in this encounter.   Labs or Scans Ordered during this encounter: No orders of the defined types were placed in this encounter.     Follow up Routine OB care Dr Constance Haw to do wide local excision today

## 2022-07-07 IMAGING — DX DG ANKLE COMPLETE 3+V*L*
3 series · 3 of 3 positions shown · non-contrast
Comparison: None

CLINICAL DATA: Infection at lateral ankle for 1 month, started as a
pimple and now is bleeding

EXAM:
LEFT ANKLE COMPLETE - 3+ VIEW

[ankle ap]
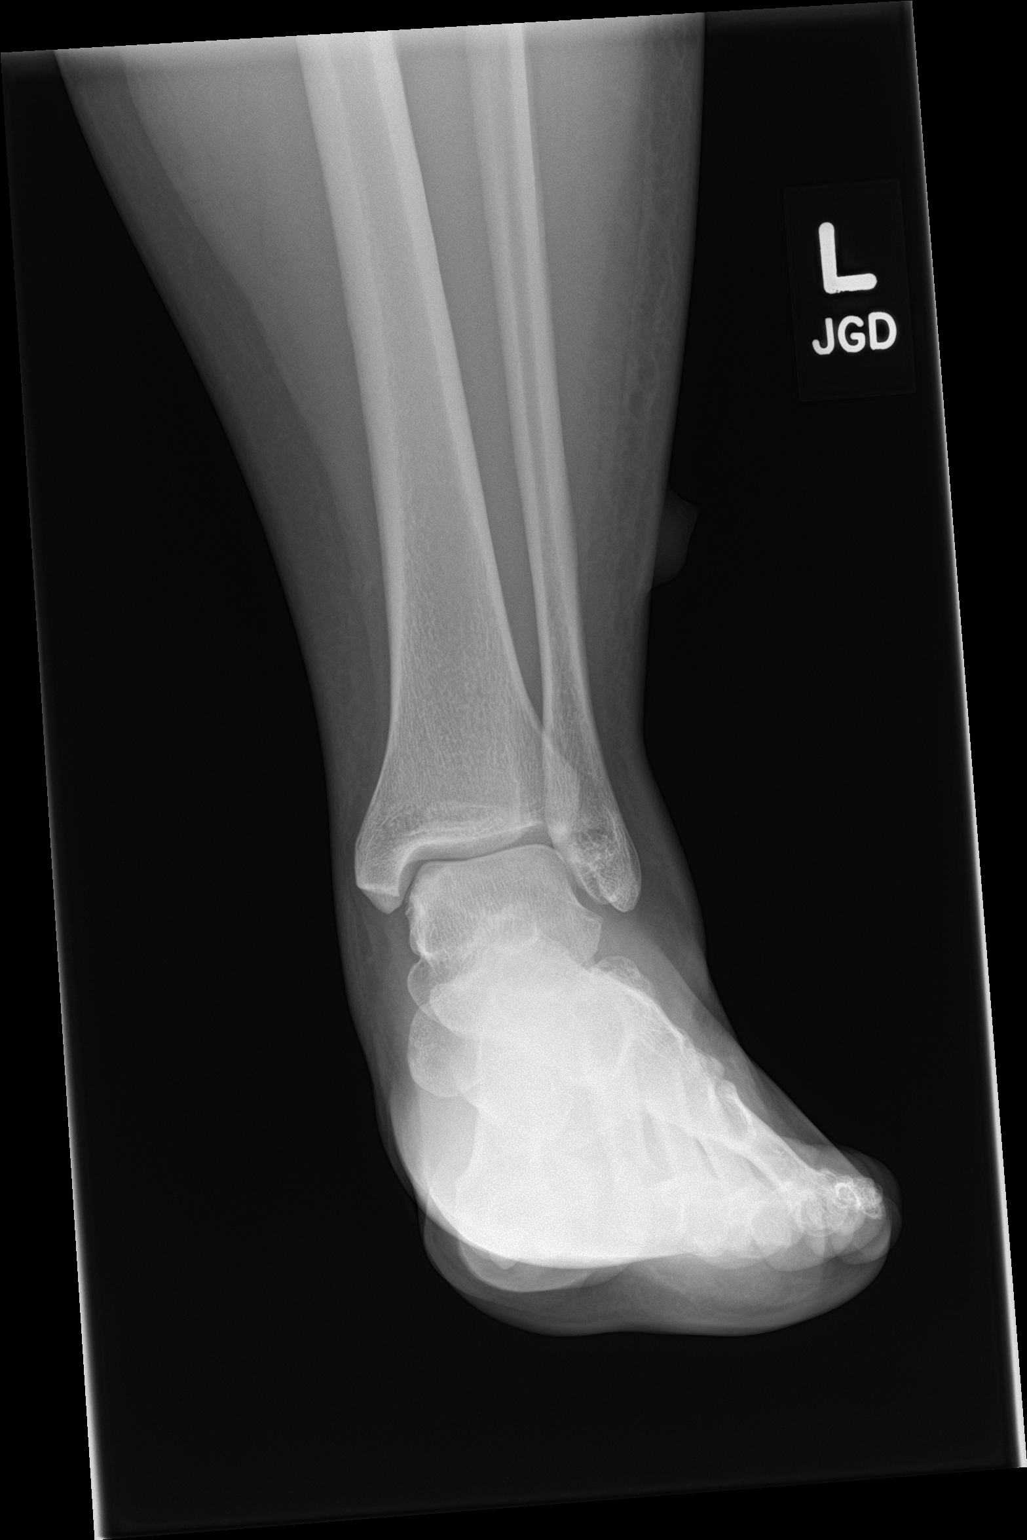

[ankle obl]
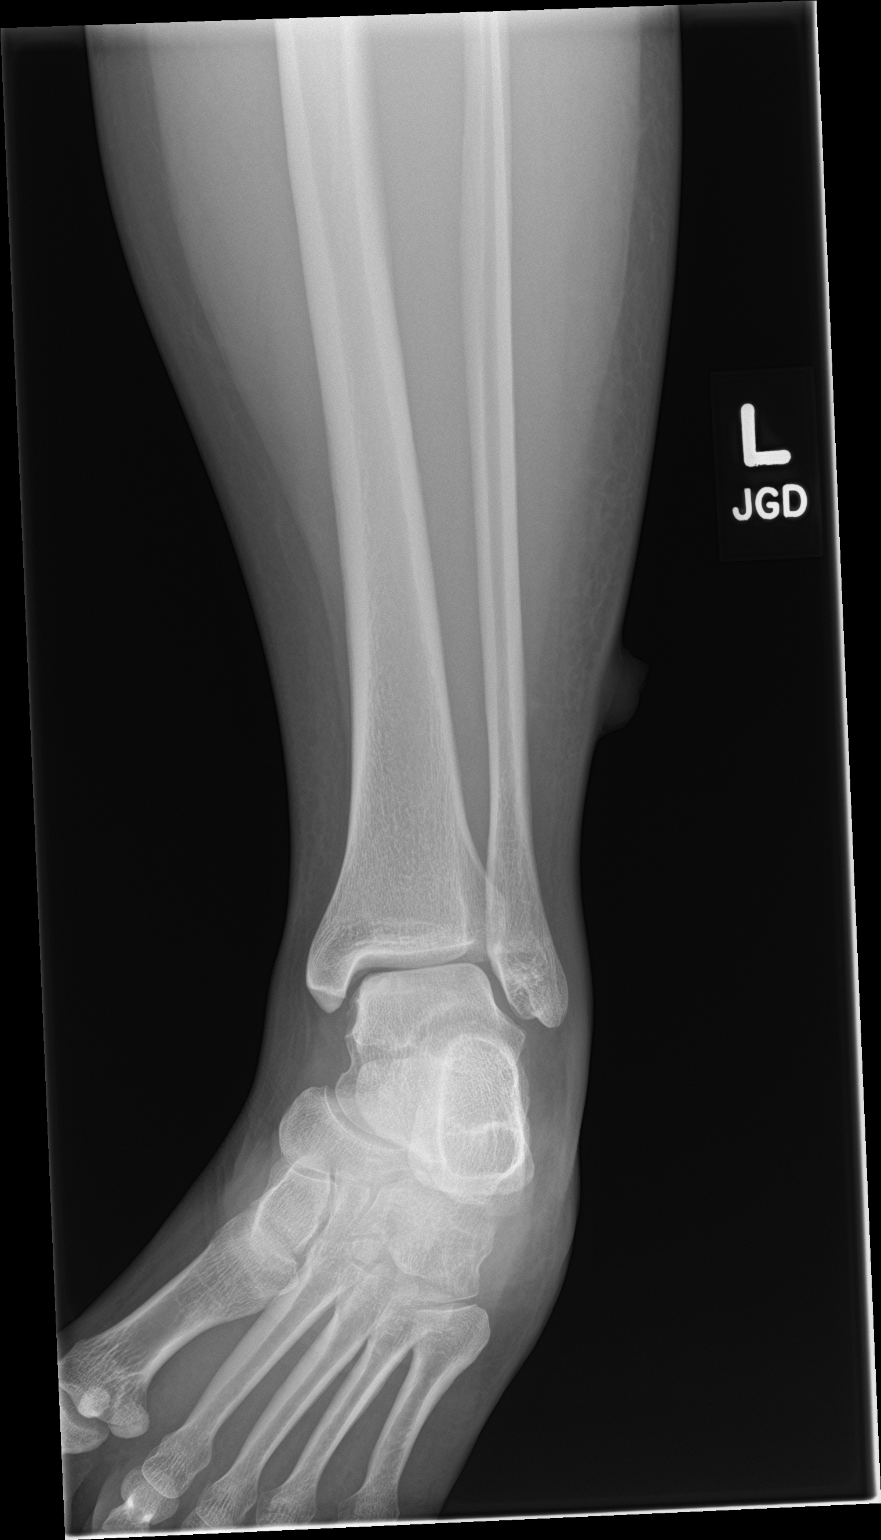

[ankle lat]
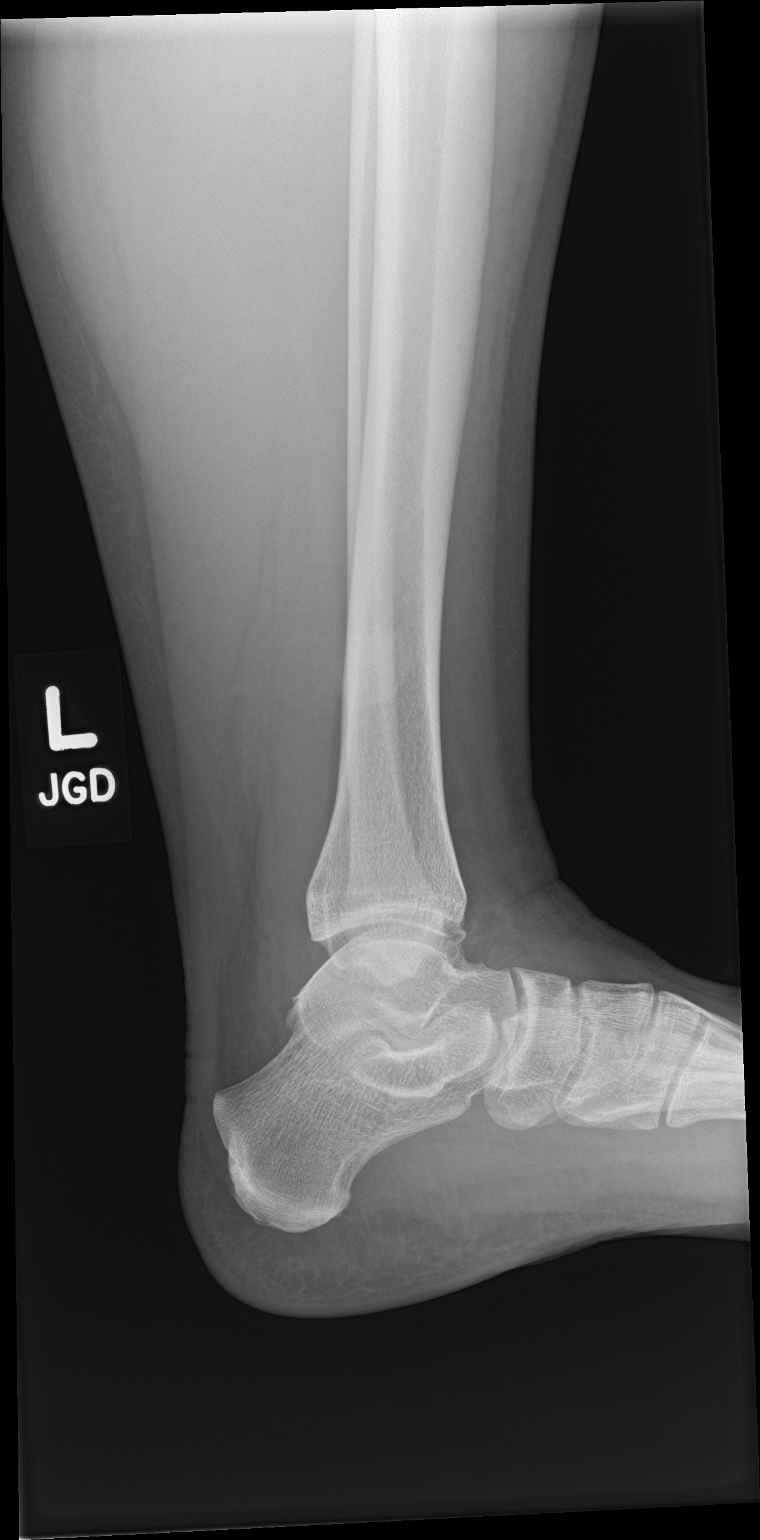

[3 of 3 positions shown; findings below may reference images not displayed]

FINDINGS: Lateral soft tissue swelling.

Osseous mineralization normal.

Joint spaces preserved.

No acute fracture, dislocation, or bone destruction.

Focal soft tissue protuberance at the lateral aspect of lower leg
2.2 cm diameter.

No soft tissue gas.
IMPRESSION: No acute osseous abnormalities.

## 2023-09-18 ENCOUNTER — Emergency Department (HOSPITAL_COMMUNITY)
Admission: EM | Admit: 2023-09-18 | Discharge: 2023-09-18 | Disposition: A | Discharge status: Home / Self Care | Payer: Medicaid Other | Attending: Emergency Medicine | Admitting: Emergency Medicine

## 2023-09-18 ENCOUNTER — Other Ambulatory Visit: Payer: Self-pay

## 2023-09-18 ENCOUNTER — Encounter (HOSPITAL_COMMUNITY): Payer: Self-pay | Admitting: *Deleted

## 2023-09-18 ENCOUNTER — Emergency Department (HOSPITAL_COMMUNITY): Payer: Medicaid Other

## 2023-09-18 DIAGNOSIS — J069 Acute upper respiratory infection, unspecified: Secondary | ICD-10-CM | POA: Insufficient documentation

## 2023-09-18 DIAGNOSIS — R059 Cough, unspecified: Secondary | ICD-10-CM | POA: Diagnosis present

## 2023-09-18 DIAGNOSIS — Z1152 Encounter for screening for COVID-19: Secondary | ICD-10-CM | POA: Insufficient documentation

## 2023-09-18 LAB — RESP PANEL BY RT-PCR (RSV, FLU A&B, COVID)  RVPGX2
Influenza A by PCR: NEGATIVE
Influenza B by PCR: NEGATIVE
Resp Syncytial Virus by PCR: NEGATIVE
SARS Coronavirus 2 by RT PCR: NEGATIVE

## 2023-09-18 MED ORDER — BENZONATATE 100 MG PO CAPS: 100.0000 mg | ORAL_CAPSULE | Freq: Four times a day (QID) | ORAL | 0 refills | Status: AC | PRN

## 2023-09-18 NOTE — Discharge Instructions (Addendum)
Return if any problems.

## 2023-09-18 NOTE — ED Provider Notes (Signed)
[place] EMERGENCY DEPARTMENT AT [facility] Provider Note   CSN: [id number] Arrival date & time: 09/18/23  0040     History  Chief Complaint  Patient presents with   Cough    Shannon Diaz is a 33 y.o. female.  Patient complains of a cough and congestion.  Patient reports that she has pain in both of her ears.  Complains of a sore throat.  The history is provided by the patient. No language interpreter was used.  Cough Cough characteristics:  Non-productive Sputum characteristics:  Nondescript Severity:  Moderate      Home Medications Prior to Admission medications   Medication Sig Start Date End Date Taking? Authorizing Provider  benzonatate (TESSALON PERLES) 100 MG capsule Take 1 capsule (100 mg total) by mouth every 6 (six) hours as needed for cough. 09/18/23 09/17/24 Yes Elson Areas, PA-C  ferrous sulfate 325 (65 FE) MG tablet Take 1 tablet (325 mg total) by mouth every other day. 11/19/21 01/18/22  Warner Mccreedy, MD  furosemide (LASIX) 20 MG tablet Take 1 tablet (20 mg total) by mouth daily for 3 days. 11/18/21 11/21/21  Warner Mccreedy, MD  naproxen (NAPROSYN) 500 MG tablet Take 1 tablet (500 mg total) by mouth 2 (two) times daily as needed for moderate pain. 05/01/22   Petrucelli, Samantha R, PA-C  NIFEdipine (ADALAT CC) 30 MG 24 hr tablet Take 1 tablet (30 mg total) by mouth daily. Patient not taking: Reported on 12/24/2021 11/18/21 01/17/22  Warner Mccreedy, MD  prenatal vitamin w/FE, FA (PRENATAL 1 + 1) 27-1 MG TABS tablet Take 1 tablet by mouth daily at 12 noon. 05/15/21   Adline Potter, NP      Allergies    Penicillins    Review of Systems   Review of Systems  Respiratory:  Positive for cough.   All other systems reviewed and are negative.   Physical Exam Updated Vital Signs BP 133/73 (BP Location: Right Arm)   Pulse 83   Temp 98.1 F (36.7 C) (Oral)   Resp 18   Ht 5\' 1"  (1.549 m)   Wt 100.7 kg   LMP 09/10/2023   SpO2 99%   BMI 41.95  kg/m  Physical Exam Vitals and nursing note reviewed.  Constitutional:      Appearance: She is well-developed.  HENT:     Head: Normocephalic.  Cardiovascular:     Rate and Rhythm: Normal rate.  Pulmonary:     Effort: Pulmonary effort is normal.  Abdominal:     General: There is no distension.  Musculoskeletal:        General: Normal range of motion.     Cervical back: Normal range of motion.  Neurological:     General: No focal deficit present.     Mental Status: She is alert and oriented to person, place, and time.     ED Results / Procedures / Treatments   Labs (all labs ordered are listed, but only abnormal results are displayed) Labs Reviewed  RESP PANEL BY RT-PCR (RSV, FLU A&B, COVID)  [id number]    EKG EKG Interpretation Date/Time:  Thursday September 18 2023 06:09:35 EST Ventricular Rate:  121 PR Interval:  160 QRS Duration:  86 QT Interval:  316 QTC Calculation: 448 R Axis:   95  Text Interpretation: Sinus tachycardia Rightward axis Borderline ECG No previous ECGs available Confirmed by Gwyneth Sprout ([id number]) on 09/18/2023 8:12:36 AM  Radiology DG Chest 2 View Result Date: 09/18/2023 CLINICAL DATA:  cough for one week no temp  congested EXAM: CHEST - 2 VIEW COMPARISON:  None Available. FINDINGS: The heart and mediastinal contours are within normal limits. No focal consolidation. No pulmonary edema. No pleural effusion. No pneumothorax. No acute osseous abnormality. IMPRESSION: No active cardiopulmonary disease. Electronically Signed   By: Tish Frederickson M.D.   On: 09/18/2023 01:36    Procedures Procedures    Medications Ordered in ED Medications - No data to display  ED Course/ Medical Decision Making/ A&P                                 Medical Decision Making Complains of a cough and congestion.  Amount and/or Complexity of Data Reviewed Labs: ordered. Decision-making details documented in ED Course.    Details: Bovid flu and influenza ordered  and are negative  Risk Prescription drug management. Risk Details: Pt given a prescription for Tessalon Perles.           Final Clinical Impression(s) / ED Diagnoses Final diagnoses:  Upper respiratory tract infection, unspecified type    Rx / DC Orders ED Discharge Orders          Ordered    benzonatate (TESSALON PERLES) 100 MG capsule  Every 6 hours PRN        09/18/23 1100           An After Visit Summary was printed and given to the patient.    Elson Areas, [place] 09/18/23 1517    Tegeler, Canary Brim, MD 09/18/23 [phone]

## 2023-09-18 NOTE — ED Triage Notes (Signed)
The pt is c/o a cough and cold for one week  no known temp

## 2023-12-07 ENCOUNTER — Encounter (HOSPITAL_COMMUNITY): Payer: Self-pay

## 2023-12-07 ENCOUNTER — Emergency Department (HOSPITAL_COMMUNITY)
Admission: EM | Admit: 2023-12-07 | Discharge: 2023-12-07 | Disposition: A | Discharge status: Home / Self Care | Attending: Emergency Medicine | Admitting: Emergency Medicine

## 2023-12-07 DIAGNOSIS — M7989 Other specified soft tissue disorders: Secondary | ICD-10-CM | POA: Insufficient documentation

## 2023-12-07 NOTE — ED Triage Notes (Signed)
 Patients wedding ring is stuck on her finger. States her finger has been swollen for about an hour. Denies any injury to the finger to cause swelling. No swelling seen in any the other fingers.

## 2023-12-07 NOTE — ED Provider Notes (Signed)
 [place] EMERGENCY DEPARTMENT AT [facility] Provider Note   CSN: [id number] Arrival date & time: 12/07/23  4098     History  Chief Complaint  Patient presents with   Finger Injury    Shannon Diaz is a 33 y.o. female.  HPI   33 year old female presents emergency department with complaints of having her wedding and engagement ring stuck on her ring finger.  States that her finger has been swollen for the past hour or so.  Reports worsening pain.  Has tried multiple attempts at home trying to have the ring removed as well as multiple times in triage area without improvement.  Requesting to have rings cut off.  Denies any trauma to affected digit.  Past medical history significant for gestational diabetes, preeclampsia, sickle cell trait  Home Medications Prior to Admission medications   Medication Sig Start Date End Date Taking? Authorizing Provider  benzonatate (TESSALON PERLES) 100 MG capsule Take 1 capsule (100 mg total) by mouth every 6 (six) hours as needed for cough. 09/18/23 09/17/24  Elson Areas, PA-C  ferrous sulfate 325 (65 FE) MG tablet Take 1 tablet (325 mg total) by mouth every other day. 11/19/21 01/18/22  Warner Mccreedy, MD  furosemide (LASIX) 20 MG tablet Take 1 tablet (20 mg total) by mouth daily for 3 days. 11/18/21 11/21/21  Warner Mccreedy, MD  naproxen (NAPROSYN) 500 MG tablet Take 1 tablet (500 mg total) by mouth 2 (two) times daily as needed for moderate pain. 05/01/22   Petrucelli, Samantha R, PA-C  NIFEdipine (ADALAT CC) 30 MG 24 hr tablet Take 1 tablet (30 mg total) by mouth daily. Patient not taking: Reported on 12/24/2021 11/18/21 01/17/22  Warner Mccreedy, MD  prenatal vitamin w/FE, FA (PRENATAL 1 + 1) 27-1 MG TABS tablet Take 1 tablet by mouth daily at 12 noon. 05/15/21   Adline Potter, NP      Allergies    Penicillins    Review of Systems   Review of Systems  All other systems reviewed and are negative.   Physical Exam Updated Vital  Signs BP 138/73 (BP Location: Right Arm)   Pulse 83   Temp 98.2 F (36.8 C)   Resp 18   Ht 5\' 1"  (1.549 m)   LMP 11/30/2023 (Approximate)   SpO2 98%   BMI 41.95 kg/m  Physical Exam Vitals and nursing note reviewed.  Constitutional:      General: She is not in acute distress.    Appearance: She is well-developed.  HENT:     Head: Normocephalic and atraumatic.  Eyes:     Conjunctiva/sclera: Conjunctivae normal.  Cardiovascular:     Rate and Rhythm: Normal rate and regular rhythm.     Heart sounds: No murmur heard. Pulmonary:     Effort: Pulmonary effort is normal. No respiratory distress.     Breath sounds: Normal breath sounds.  Abdominal:     Palpations: Abdomen is soft.     Tenderness: There is no abdominal tenderness.  Musculoskeletal:        General: No swelling.     Cervical back: Neck supple.     Comments: Swelling diffusely ring finger left hand.  Full range of motion of digit.  Cap refill less than 2 seconds.  Wedding ring as well as engagement band in place.  Skin:    General: Skin is warm and dry.     Capillary Refill: Capillary refill takes less than 2 seconds.  Neurological:  Mental Status: She is alert.  Psychiatric:        Mood and Affect: Mood normal.     ED Results / Procedures / Treatments   Labs (all labs ordered are listed, but only abnormal results are displayed) Labs Reviewed - No data to display  EKG None  Radiology No results found.  Procedures Procedures    Medications Ordered in ED Medications - No data to display  ED Course/ Medical Decision Making/ A&P                                 Medical Decision Making  This patient presents to the ED for concern of finger swelling, this involves an extensive number of treatment options, and is a complaint that carries with it a high risk of complications and morbidity.  The differential diagnosis includes fracture, dislocation, strain/sprain, cellulitis, necrotizing infection,  ischemic digit, other   Co morbidities that complicate the patient evaluation  See HPI   Additional history obtained:  Additional history obtained from EMR External records from outside source obtained and reviewed including hospital records   Lab Tests:  N/a   Imaging Studies ordered:  N/a   Cardiac Monitoring: / EKG:  The patient was maintained on a cardiac monitor.  I personally viewed and interpreted the cardiac monitored which showed an underlying rhythm of: sinus rhythm   Consultations Obtained:  N/a   Problem List / ED Course / Critical interventions / Medication management  Finger swelling Reevaluation of the patient showed that the patient stayed the same I have reviewed the patients home medicines and have made adjustments as needed   Social Determinants of Health:  Denies tobacco, licit drug use.   Test / Admission - Considered:  Finger swelling Vitals signs within normal range and stable throughout visit. 33 year old female presents emergency department with complaints of having her wedding and engagement ring stuck on her ring finger.  States that her finger has been swollen for the past hour or so.  Reports worsening pain.  Has tried multiple attempts at home trying to have the ring removed as well as multiple times in triage area without improvement.  Requesting to have rings cut off.  Denies any trauma to affected digit. On exam, diffusely swollen ring finger left hand.  Full range of motion with intact neurovascular status.  Rings removed using ring cutter with relief of patient's discomfort.  Will recommend symptomatic therapy as described in AVS and close follow-up with PCP in the outpatient setting.  Treatment plan discussed with patient and she acknowledged understanding was agreeable to said plan.  Patient overall well-appearing, afebrile in no acute distress. Worrisome signs and symptoms were discussed with the patient, and the patient  acknowledged understanding to return to the ED if noticed. Patient was stable upon discharge.          Final Clinical Impression(s) / ED Diagnoses Final diagnoses:  Finger swelling    Rx / DC Orders ED Discharge Orders     None         Peter Garter, [place] 12/07/23 0431    Tilden Fossa, MD 12/07/23 [phone]

## 2024-02-07 ENCOUNTER — Emergency Department (HOSPITAL_COMMUNITY)
Admission: EM | Admit: 2024-02-07 | Discharge: 2024-02-07 | Disposition: A | Discharge status: Home / Self Care | Attending: Emergency Medicine | Admitting: Emergency Medicine

## 2024-02-07 ENCOUNTER — Other Ambulatory Visit: Payer: Self-pay

## 2024-02-07 ENCOUNTER — Encounter (HOSPITAL_COMMUNITY): Payer: Self-pay | Admitting: *Deleted

## 2024-02-07 DIAGNOSIS — H6093 Unspecified otitis externa, bilateral: Secondary | ICD-10-CM | POA: Insufficient documentation

## 2024-02-07 DIAGNOSIS — H9203 Otalgia, bilateral: Secondary | ICD-10-CM | POA: Diagnosis present

## 2024-02-07 MED ORDER — ACETAMINOPHEN 325 MG PO TABS
650.0000 mg | ORAL_TABLET | Freq: Once | ORAL | Status: AC
  Administered 2024-02-07: 650 mg via ORAL
  Filled 2024-02-07: qty 2

## 2024-02-07 MED ORDER — IBUPROFEN 800 MG PO TABS
800.0000 mg | ORAL_TABLET | Freq: Once | ORAL | Status: AC
  Administered 2024-02-07: 800 mg via ORAL
  Filled 2024-02-07: qty 1

## 2024-02-07 MED ORDER — OFLOXACIN 0.3 % OT SOLN: 5.0000 [drp] | Freq: Two times a day (BID) | OTIC | 2 refills | Status: AC

## 2024-02-07 MED ORDER — OFLOXACIN 0.3 % OT SOLN: 5.0000 [drp] | Freq: Two times a day (BID) | OTIC | 2 refills | Status: DC

## 2024-02-07 NOTE — ED Triage Notes (Signed)
 The pt is here with bi-lateral ear pain since Thursday.  She was seen at urgent care yesterday and she reports that here was a pimple in her rt ear  and it was treated but today the pain was worse  lmp last month

## 2024-02-07 NOTE — Discharge Instructions (Addendum)
 Evaluation today revealed that you have bilateral external ear infections.  I have sent antibiotic eardrops to your pharmacy.  Please apply for the next 7 days.  Recommend that you follow-up with ENT if your symptoms persist.  If you develop a fever, persistent pustulant discharge, have neck pain or severe headache or any other concerning symptom please return to the ED for further evaluation.

## 2024-02-07 NOTE — ED Provider Notes (Signed)
 [place] EMERGENCY DEPARTMENT AT [facility] Provider Note   CSN: [id number] Arrival date & time: 02/07/24  1758     History  Chief Complaint  Patient presents with   Otalgia   HPI Shannon Diaz is a 33 y.o. female presenting for bilateral otalgia.  Started this past Thursday.  First was in the left ear but now in the right ear as well and the pain in the right ear seems to be worse.  Pain at times radiates into the upper jaw bilaterally.  Denies hearing loss or tinnitus.  Denies any tenderness behind the ear.  Denies recent swimming in the lake or hot tub or pool.  Denies fever and chills.  She reports that she did go on a trip by plane to visit family abroad this was about a month ago.  Does not recall any ear pain after she returned.  Denies sore throat, cough and nasal congestion.   Otalgia      Home Medications Prior to Admission medications   Medication Sig Start Date End Date Taking? Authorizing Provider  ofloxacin (FLOXIN) 0.3 % OTIC solution Place 5 drops into both ears 2 (two) times daily for 7 days. 02/07/24 02/14/24 Yes Arias Weinert K, PA-C  benzonatate  (TESSALON  PERLES) 100 MG capsule Take 1 capsule (100 mg total) by mouth every 6 (six) hours as needed for cough. 09/18/23 09/17/24  Sandi Crosby, PA-C  ferrous sulfate  325 (65 FE) MG tablet Take 1 tablet (325 mg total) by mouth every other day. 11/19/21 01/18/22  Willeen Harold, MD  furosemide  (LASIX ) 20 MG tablet Take 1 tablet (20 mg total) by mouth daily for 3 days. 11/18/21 11/21/21  Willeen Harold, MD  naproxen  (NAPROSYN ) 500 MG tablet Take 1 tablet (500 mg total) by mouth 2 (two) times daily as needed for moderate pain. 05/01/22   Petrucelli, Samantha R, PA-C  NIFEdipine  (ADALAT  CC) 30 MG 24 hr tablet Take 1 tablet (30 mg total) by mouth daily. Patient not taking: Reported on 12/24/2021 11/18/21 01/17/22  Das, Anuka, MD  prenatal vitamin w/FE, FA (PRENATAL 1 + 1) 27-1 MG TABS tablet Take 1 tablet by mouth daily at  12 noon. 05/15/21   Javan Messing, NP      Allergies    Penicillins    Review of Systems   Review of Systems  HENT:  Positive for ear pain.     Physical Exam   Vitals:   02/07/24 1928  BP: (!) 155/106  Pulse: (!) 106  Resp: 18  Temp: 99.4 F (37.4 C)  SpO2: 100%    CONSTITUTIONAL:  well-appearing, NAD NEURO:  Alert and oriented x 3, CN 3-12 grossly intact EYES:  eyes equal and reactive ENT/NECK:  Supple, no stridor, both ear canals are edematous with pustulant discharge noted external ear is tender to touch.  No mastoid tenderness no cervical lymphadenopathy.  Posterior oropharynx looks clear without edema or erythema. CARDIO:  appears well-perfused  PULM:  No respiratory distress, CTAB GI/GU:  non-distended MSK/SPINE:  No gross deformities, no edema, moves all extremities  SKIN:  no rash, atraumatic  *Additional and/or pertinent findings included in MDM below   ED Results / Procedures / Treatments   Labs (all labs ordered are listed, but only abnormal results are displayed) Labs Reviewed  POC URINE PREG, ED    EKG None  Radiology No results found.  Procedures Procedures    Medications Ordered in ED Medications  acetaminophen  (TYLENOL ) tablet 650 mg (650  mg Oral Given 02/07/24 1950)  ibuprofen  (ADVIL ) tablet 800 mg (800 mg Oral Given 02/07/24 2236)    ED Course/ Medical Decision Making/ A&P                                 Medical Decision Making  33 year old well-appearing female presenting for bilateral otalgia.  Exam findings are concerning for otitis externa bilaterally.  Also considered mastoiditis but unlikely given no mastoid tenderness.  Considered malignant otitis externa as well but workup does not suggest sepsis and symptoms started 3 days ago.  Could not visualize the TM bilaterally.  Sent antibiotic drops to her pharmacy.  Advised if her symptoms persisted or worsen that she should follow-up with the ENT otherwise she could follow-up with  her PCP.  Discussed Tylenol  and ibuprofen  at home for pain and fever control.  Discussed return precautions.  Discharged in good condition.        Final Clinical Impression(s) / ED Diagnoses Final diagnoses:  Otitis externa of both ears, unspecified chronicity, unspecified type    Rx / DC Orders ED Discharge Orders          Ordered    ofloxacin (FLOXIN) 0.3 % OTIC solution  2 times daily        02/07/24 2237              Janalee Mcmurray, PA-C 02/07/24 2245    Dalene Duck, MD 02/08/24 [phone]

## 2024-08-11 ENCOUNTER — Emergency Department (HOSPITAL_COMMUNITY)
Admission: EM | Admit: 2024-08-11 | Discharge: 2024-08-12 | Disposition: A | Discharge status: Home / Self Care | Payer: Self-pay | Attending: Emergency Medicine | Admitting: Emergency Medicine

## 2024-08-11 ENCOUNTER — Other Ambulatory Visit: Payer: Self-pay

## 2024-08-11 DIAGNOSIS — S93492A Sprain of other ligament of left ankle, initial encounter: Secondary | ICD-10-CM | POA: Insufficient documentation

## 2024-08-11 DIAGNOSIS — M545 Low back pain, unspecified: Secondary | ICD-10-CM | POA: Insufficient documentation

## 2024-08-11 DIAGNOSIS — X501XXA Overexertion from prolonged static or awkward postures, initial encounter: Secondary | ICD-10-CM | POA: Insufficient documentation

## 2024-08-11 DIAGNOSIS — S93402A Sprain of unspecified ligament of left ankle, initial encounter: Secondary | ICD-10-CM

## 2024-08-11 DIAGNOSIS — W19XXXA Unspecified fall, initial encounter: Secondary | ICD-10-CM

## 2024-08-11 NOTE — ED Triage Notes (Signed)
 Pt reports falling down the stairs backwards. Denies hitting her head. Pt reports having back/leg/hip pain. Pt ambulatory in triage.

## 2024-08-11 NOTE — ED Triage Notes (Signed)
 Patient states she fell down 3 steps. She states her lower back and left hip is hurting. She states her left foot is also hurting. Patient ambulatory on arrival to ED. Denies LOC or hitting head.

## 2024-08-12 ENCOUNTER — Encounter (HOSPITAL_COMMUNITY): Payer: Self-pay | Admitting: Emergency Medicine

## 2024-08-12 ENCOUNTER — Emergency Department (HOSPITAL_COMMUNITY): Payer: Self-pay

## 2024-08-12 LAB — POC URINE PREG, ED: Preg Test, Ur: NEGATIVE

## 2024-08-12 MED ORDER — ACETAMINOPHEN 325 MG PO TABS
650.0000 mg | ORAL_TABLET | Freq: Once | ORAL | Status: AC
  Administered 2024-08-12: 650 mg via ORAL
  Filled 2024-08-12: qty 2

## 2024-08-12 MED ORDER — IBUPROFEN 400 MG PO TABS
600.0000 mg | ORAL_TABLET | Freq: Once | ORAL | Status: AC
  Administered 2024-08-12: 600 mg via ORAL
  Filled 2024-08-12: qty 1

## 2024-08-12 NOTE — ED Notes (Signed)
 Awaiting pt from lobby

## 2024-08-12 NOTE — ED Notes (Signed)
 ASO brace applied and crutches fitted, pt verbalized feeling comfortable with the brace and crutches.

## 2024-08-12 NOTE — ED Notes (Signed)
 Ortho Tech called to apply ASO brace and fit crutches.

## 2024-08-12 NOTE — ED Provider Notes (Signed)
 [place] EMERGENCY DEPARTMENT AT [facility] Provider Note   CSN: [id number] Arrival date & time: 08/11/24  1958     Patient presents with: Fall, Back Pain, Hip Pain, and Foot Pain   Shannon Diaz is a 33 y.o. female.   Shannon Diaz is a 33 y.o. female who presents to the emergency department for evaluation of back and left ankle pain after a fall.  She reports yesterday she rolled her ankle and then proceeded to fall landing on her butt and sliding down 3 steps.  She reports since then she has had persistent pain in her left foot and ankle and pain in her low back.  Patient reports she has been walking but it is hard to put weight on the left ankle.  Took 1 dose of ibuprofen  400 mg yesterday at 2 PM with minimal relief and has not tried anything else to treat her symptoms.  No history of back or ankle injury previously.  No other aggravating or alleviating factors.  The history is provided by the patient and medical records.  Fall Pertinent negatives include no chest pain, no abdominal pain and no shortness of breath.  Back Pain Associated symptoms: no abdominal pain, no chest pain, no dysuria, no fever, no numbness and no weakness   Hip Pain Pertinent negatives include no chest pain, no abdominal pain and no shortness of breath.  Foot Pain Pertinent negatives include no chest pain, no abdominal pain and no shortness of breath.       Prior to Admission medications  Medication Sig Start Date End Date Taking? Authorizing Provider  benzonatate  (TESSALON  PERLES) 100 MG capsule Take 1 capsule (100 mg total) by mouth every 6 (six) hours as needed for cough. 09/18/23 09/17/24  Flint Sonny POUR, PA-C  ferrous sulfate  325 (65 FE) MG tablet Take 1 tablet (325 mg total) by mouth every other day. 11/19/21 01/18/22  Virgilio Payor, MD  furosemide  (LASIX ) 20 MG tablet Take 1 tablet (20 mg total) by mouth daily for 3 days. 11/18/21 11/21/21  Virgilio Payor, MD  naproxen   (NAPROSYN ) 500 MG tablet Take 1 tablet (500 mg total) by mouth 2 (two) times daily as needed for moderate pain. 05/01/22   Petrucelli, Samantha R, PA-C  NIFEdipine  (ADALAT  CC) 30 MG 24 hr tablet Take 1 tablet (30 mg total) by mouth daily. Patient not taking: Reported on 12/24/2021 11/18/21 01/17/22  Das, Anuka, MD  prenatal vitamin w/FE, FA (PRENATAL 1 + 1) 27-1 MG TABS tablet Take 1 tablet by mouth daily at 12 noon. 05/15/21   Signa Delon LABOR, NP    Allergies: Penicillins    Review of Systems  Constitutional:  Negative for chills and fever.  HENT: Negative.    Respiratory:  Negative for shortness of breath.   Cardiovascular:  Negative for chest pain.  Gastrointestinal:  Negative for abdominal pain, constipation, diarrhea, nausea and vomiting.  Genitourinary:  Negative for dysuria, flank pain, frequency and hematuria.  Musculoskeletal:  Positive for arthralgias and back pain. Negative for gait problem, joint swelling, myalgias and neck pain.  Skin:  Negative for color change, rash and wound.  Neurological:  Negative for weakness and numbness.    Updated Vital Signs BP (!) 147/92 (BP Location: Left Arm)   Pulse 96   Temp 97.9 F (36.6 C)   Resp 16   Ht 5' 1 (1.549 m)   Wt 110.7 kg   SpO2 98%   BMI 46.10 kg/m   Physical Exam  Vitals and nursing note reviewed.  Constitutional:      General: She is not in acute distress.    Appearance: She is well-developed. She is not diaphoretic.  HENT:     Head: Normocephalic and atraumatic.  Eyes:     General:        Right eye: No discharge.        Left eye: No discharge.  Cardiovascular:     Pulses:          Radial pulses are 2+ on the right side and 2+ on the left side.       Dorsalis pedis pulses are 2+ on the right side and 2+ on the left side.       Posterior tibial pulses are 2+ on the right side and 2+ on the left side.  Pulmonary:     Effort: Pulmonary effort is normal. No respiratory distress.  Abdominal:     General: Bowel  sounds are normal. There is no distension.     Palpations: Abdomen is soft. There is no mass.     Tenderness: There is no abdominal tenderness. There is no guarding.     Comments: Abdomen soft, nondistended, nontender to palpation in all quadrants without guarding or peritoneal signs, no CVA tenderness bilaterally  Musculoskeletal:        General: Swelling and tenderness present.     Cervical back: Neck supple.     Comments: Tenderness to palpation over midline lower lumbar spine without palpable step-off or deformity or overlying bruising.  Negative straight leg raise bilaterally.  Tenderness over the lateral malleolus of the left ankle with mild swelling, no significant tenderness of the distal forefoot or plantar surface of the foot with no overlying ecchymosis.  No tenderness or pain at the knee or over the bony prominence of the hip.  Distal pulses 2+ and equal in bilateral lower extremities.  Skin:    General: Skin is warm and dry.     Capillary Refill: Capillary refill takes less than 2 seconds.  Neurological:     Mental Status: She is alert and oriented to person, place, and time.     Comments: Alert, clear speech, following commands. Moving all extremities without difficulty. Bilateral lower extremities with 5/5 strength in proximal and distal muscle groups and with dorsi and plantar flexion. Sensation intact in bilateral lower extremities. 2+ patellar DTRs bilaterally. Ambulatory with steady gait  Psychiatric:        Behavior: Behavior normal.     (all labs ordered are listed, but only abnormal results are displayed) Labs Reviewed  POC URINE PREG, ED    EKG: None  Radiology: DG Lumbar Spine Complete Result Date: 08/12/2024 CLINICAL DATA:  Fall.  Low back is hurting. EXAM: LUMBAR SPINE - COMPLETE 4+ VIEW COMPARISON:  None Available. FINDINGS: There is no evidence of lumbar spine fracture. Alignment is normal. Intervertebral disc spaces are maintained. IMPRESSION: Negative.  Electronically Signed   By: Camellia Candle M.D.   On: 08/12/2024 12:41   DG Ankle Complete Left Result Date: 08/12/2024 CLINICAL DATA:  Follow-up. EXAM: LEFT ANKLE COMPLETE - 3+ VIEW COMPARISON:  None Available. FINDINGS: There is no evidence of fracture, dislocation, or joint effusion. There is no evidence of arthropathy or other focal bone abnormality. Soft tissues are unremarkable. IMPRESSION: Negative. Electronically Signed   By: Camellia Candle M.D.   On: 08/12/2024 12:40     Procedures   Medications Ordered in the ED  ibuprofen  (ADVIL ) tablet 600 mg (  600 mg Oral Given 08/12/24 0954)  acetaminophen  (TYLENOL ) tablet 650 mg (650 mg Oral Given 08/12/24 9046)                                    Medical Decision Making Amount and/or Complexity of Data Reviewed Radiology: ordered.  Risk OTC drugs.   33 year old female presents after fall that occurred yesterday rolling her ankle and landing on her low back.  No obvious deformity on exam, all extremities neurovascularly intact and bilateral lower extremities with no focal neurologic deficits.  Pain treated in the ED with ibuprofen  and Tylenol .  Suspicion for sprain and muscle soreness after fall but x-rays obtained to rule out fracture.  X-rays of the left ankle and lumbar spine viewed and interpreted independently without evidence of fracture.  Will treat for ankle sprain and soft tissue injury after fall.  Given ASO brace and crutches and discussed supportive care at home and follow-up with orthopedics if not improving.  At this time there does not appear to be any evidence of an acute emergency medical condition requiring further emergent evaluation and the patient appears stable for discharge with appropriate outpatient follow up. Diagnosis and return precautions discussed with patient who verbalizes understanding and is agreeable to discharge.        Final diagnoses:  Fall, initial encounter  Sprain of left ankle, unspecified  ligament, initial encounter  Acute midline low back pain without sciatica    ED Discharge Orders     None          Alva Larraine JULIANNA DEVONNA 08/12/24 1305    Yolande Lamar BROCKS, MD 08/13/24 [phone]

## 2024-08-12 NOTE — Discharge Instructions (Signed)
 Your x-rays did not show any fractures.  Suspect you have an ankle sprain and soreness in your low back from fall.  Take ibuprofen  600 mg and Tylenol  650 mg every 6 hours for pain.  You can alternate applying ice and heat to your back and ankle to help with pain as well.  Use ankle brace provided today and use crutches as needed as pain improves and you are able to bear weight you can stop using the crutches.  Follow-up with orthopedics if your pain is not improving after 1 week.
